# Patient Record
Sex: Female | Born: 1990 | Hispanic: Yes | Marital: Single | Smoking: Never smoker
Health system: Southern US, Community
[De-identification: ages and names within clinical notes are randomized; demographics above are authoritative.]

## PROBLEM LIST (undated history)

## (undated) ENCOUNTER — Inpatient Hospital Stay (HOSPITAL_COMMUNITY): Payer: Self-pay

## (undated) DIAGNOSIS — G510 Bell's palsy: Secondary | ICD-10-CM

## (undated) DIAGNOSIS — A749 Chlamydial infection, unspecified: Secondary | ICD-10-CM

## (undated) DIAGNOSIS — D649 Anemia, unspecified: Secondary | ICD-10-CM

## (undated) DIAGNOSIS — R011 Cardiac murmur, unspecified: Secondary | ICD-10-CM

## (undated) DIAGNOSIS — R519 Headache, unspecified: Secondary | ICD-10-CM

## (undated) DIAGNOSIS — B999 Unspecified infectious disease: Secondary | ICD-10-CM

## (undated) DIAGNOSIS — R51 Headache: Secondary | ICD-10-CM

## (undated) DIAGNOSIS — J45909 Unspecified asthma, uncomplicated: Secondary | ICD-10-CM

## (undated) HISTORY — PX: TONSILLECTOMY: SUR1361

---

## 2013-07-06 DIAGNOSIS — A749 Chlamydial infection, unspecified: Secondary | ICD-10-CM

## 2013-07-06 HISTORY — DX: Chlamydial infection, unspecified: A74.9

## 2016-12-06 ENCOUNTER — Inpatient Hospital Stay (HOSPITAL_COMMUNITY)
Admission: AD | Admit: 2016-12-06 | Discharge: 2016-12-06 | Disposition: A | Discharge status: Home / Self Care | Payer: Medicaid Other | Source: Ambulatory Visit | Attending: Obstetrics and Gynecology | Admitting: Obstetrics and Gynecology

## 2016-12-06 ENCOUNTER — Encounter (HOSPITAL_COMMUNITY): Payer: Self-pay | Admitting: *Deleted

## 2016-12-06 DIAGNOSIS — O210 Mild hyperemesis gravidarum: Secondary | ICD-10-CM

## 2016-12-06 DIAGNOSIS — Z3A00 Weeks of gestation of pregnancy not specified: Secondary | ICD-10-CM | POA: Insufficient documentation

## 2016-12-06 DIAGNOSIS — Z91040 Latex allergy status: Secondary | ICD-10-CM | POA: Insufficient documentation

## 2016-12-06 DIAGNOSIS — O26899 Other specified pregnancy related conditions, unspecified trimester: Secondary | ICD-10-CM | POA: Diagnosis not present

## 2016-12-06 DIAGNOSIS — R112 Nausea with vomiting, unspecified: Secondary | ICD-10-CM | POA: Diagnosis present

## 2016-12-06 DIAGNOSIS — Z825 Family history of asthma and other chronic lower respiratory diseases: Secondary | ICD-10-CM | POA: Diagnosis not present

## 2016-12-06 DIAGNOSIS — R109 Unspecified abdominal pain: Secondary | ICD-10-CM | POA: Insufficient documentation

## 2016-12-06 HISTORY — DX: Unspecified asthma, uncomplicated: J45.909

## 2016-12-06 HISTORY — DX: Cardiac murmur, unspecified: R01.1

## 2016-12-06 HISTORY — DX: Unspecified infectious disease: B99.9

## 2016-12-06 HISTORY — DX: Chlamydial infection, unspecified: A74.9

## 2016-12-06 HISTORY — DX: Anemia, unspecified: D64.9

## 2016-12-06 HISTORY — DX: Headache: R51

## 2016-12-06 HISTORY — DX: Headache, unspecified: R51.9

## 2016-12-06 HISTORY — DX: Bell's palsy: G51.0

## 2016-12-06 LAB — COMPREHENSIVE METABOLIC PANEL
ALT: 10 U/L — ABNORMAL LOW (ref 14–54)
ANION GAP: 8 (ref 5–15)
AST: 15 U/L (ref 15–41)
Albumin: 3.8 g/dL (ref 3.5–5.0)
Alkaline Phosphatase: 56 U/L (ref 38–126)
BUN: 6 mg/dL (ref 6–20)
CHLORIDE: 104 mmol/L (ref 101–111)
CO2: 22 mmol/L (ref 22–32)
Calcium: 8.7 mg/dL — ABNORMAL LOW (ref 8.9–10.3)
Creatinine, Ser: 0.49 mg/dL (ref 0.44–1.00)
Glucose, Bld: 87 mg/dL (ref 65–99)
POTASSIUM: 3.3 mmol/L — AB (ref 3.5–5.1)
Sodium: 134 mmol/L — ABNORMAL LOW (ref 135–145)
TOTAL PROTEIN: 7.1 g/dL (ref 6.5–8.1)
Total Bilirubin: 0.3 mg/dL (ref 0.3–1.2)

## 2016-12-06 LAB — URINALYSIS, ROUTINE W REFLEX MICROSCOPIC
BILIRUBIN URINE: NEGATIVE
GLUCOSE, UA: NEGATIVE mg/dL
HGB URINE DIPSTICK: NEGATIVE
KETONES UR: NEGATIVE mg/dL
NITRITE: NEGATIVE
PROTEIN: 30 mg/dL — AB
Specific Gravity, Urine: 1.019 (ref 1.005–1.030)
pH: 7 (ref 5.0–8.0)

## 2016-12-06 LAB — POCT PREGNANCY, URINE: PREG TEST UR: POSITIVE — AB

## 2016-12-06 MED ORDER — METOCLOPRAMIDE HCL 10 MG PO TABS: 10.0000 mg | ORAL_TABLET | Freq: Three times a day (TID) | ORAL | 1 refills | Status: DC

## 2016-12-06 MED ORDER — SODIUM CHLORIDE 0.9 % IV SOLN
INTRAVENOUS | Status: DC
  Administered 2016-12-06: 12:00:00 via INTRAVENOUS

## 2016-12-06 MED ORDER — GI COCKTAIL ~~LOC~~
30.0000 mL | Freq: Once | ORAL | Status: AC
  Administered 2016-12-06: 30 mL via ORAL
  Filled 2016-12-06: qty 30

## 2016-12-06 MED ORDER — METOCLOPRAMIDE HCL 5 MG/ML IJ SOLN
10.0000 mg | Freq: Once | INTRAMUSCULAR | Status: AC
  Administered 2016-12-06: 10 mg via INTRAVENOUS
  Filled 2016-12-06: qty 2

## 2016-12-06 NOTE — Discharge Instructions (Signed)
Morning Sickness °Morning sickness is when you feel sick to your stomach (nauseous) during pregnancy. This nauseous feeling may or may not come with vomiting. It often occurs in the morning but can be a problem any time of day. Morning sickness is most common during the first trimester, but it may continue throughout pregnancy. While morning sickness is unpleasant, it is usually harmless unless you develop severe and continual vomiting (hyperemesis gravidarum). This condition requires more intense treatment. °What are the causes? °The cause of morning sickness is not completely known but seems to be related to normal hormonal changes that occur in pregnancy. °What increases the risk? °You are at greater risk if you: °· Experienced nausea or vomiting before your pregnancy. °· Had morning sickness during a previous pregnancy. °· Are pregnant with more than one baby, such as twins. ° °How is this treated? °Do not use any medicines (prescription, over-the-counter, or herbal) for morning sickness without first talking to your health care provider. Your health care provider may prescribe or recommend: °· Vitamin B6 supplements. °· Anti-nausea medicines. °· The herbal medicine ginger. ° °Follow these instructions at home: °· Only take over-the-counter or prescription medicines as directed by your health care provider. °· Taking multivitamins before getting pregnant can prevent or decrease the severity of morning sickness in most women. °· Eat a piece of dry toast or unsalted crackers before getting out of bed in the morning. °· Eat five or six small meals a day. °· Eat dry and bland foods (rice, baked potato). Foods high in carbohydrates are often helpful. °· Do not drink liquids with your meals. Drink liquids between meals. °· Avoid greasy, fatty, and spicy foods. °· Get someone to cook for you if the smell of any food causes nausea and vomiting. °· If you feel nauseous after taking prenatal vitamins, take the vitamins at  night or with a snack. °· Snack on protein foods (nuts, yogurt, cheese) between meals if you are hungry. °· Eat unsweetened gelatins for desserts. °· Wearing an acupressure wristband (worn for sea sickness) may be helpful. °· Acupuncture may be helpful. °· Do not smoke. °· Get a humidifier to keep the air in your house free of odors. °· Get plenty of fresh air. °Contact a health care provider if: °· Your home remedies are not working, and you need medicine. °· You feel dizzy or lightheaded. °· You are losing weight. °Get help right away if: °· You have persistent and uncontrolled nausea and vomiting. °· You pass out (faint). °This information is not intended to replace advice given to you by your health care provider. Make sure you discuss any questions you have with your health care provider. °Document Released: 08/13/2006 Document Revised: 11/28/2015 Document Reviewed: 12/07/2012 °Elsevier Interactive Patient Education © 2017 Elsevier Inc. ° °

## 2016-12-06 NOTE — MAU Note (Signed)
Unable to void at this time.

## 2016-12-06 NOTE — MAU Provider Note (Signed)
History   Patient Erin Hines is a 26 y.o. [phone] At Unknown Here with nausea and vomiting and pain in her side from straining and throwing up. She denies bleeding, pelvic pain, abdominal pain.   CSN: [id number]  Arrival date and time: 12/06/16 9562   First Provider Initiated Contact with Patient 12/06/16 1139      Chief Complaint  Patient presents with  . Emesis During Pregnancy  . Abdominal Cramping   Abdominal Cramping  This is a chronic problem. The current episode started 1 to 4 weeks ago. The problem occurs constantly. The problem has been unchanged. Pain location: pain in her left rib. The pain is at a severity of 7/10. Associated symptoms include nausea and vomiting. Pertinent negatives include no diarrhea or fever.  Emesis   This is a chronic problem. The current episode started 1 to 4 weeks ago. The problem occurs 5 to 10 times per day. The problem has been unchanged. The emesis has an appearance of bile. There has been no fever. Pertinent negatives include no diarrhea or fever.    OB History    Gravida Para Term Preterm AB Living   5 3 3  0 1 3   SAB TAB Ectopic Multiple Live Births     1     3      Obstetric Comments   TAB- with pills      Past Medical History:  Diagnosis Date  . Anemia   . Asthma   . Bell's palsy   . Chlamydia 2015  . Headache   . Heart murmur    as child  . Infection    UTI    Past Surgical History:  Procedure Laterality Date  . TONSILLECTOMY     age 48    Family History  Problem Relation Age of Onset  . Asthma Mother   . Stroke Maternal Grandfather   . Diabetes Paternal Grandmother     Social History  Substance Use Topics  . Smoking status: Never Smoker  . Smokeless tobacco: Never Used  . Alcohol use No    Allergies:  Allergies  Allergen Reactions  . Latex Swelling and Other (See Comments)    Reaction:  Localized swelling    No prescriptions prior to admission.    Review of Systems  Constitutional: Negative  for fever.  Respiratory: Negative.   Cardiovascular: Negative.   Gastrointestinal: Positive for nausea and vomiting. Negative for diarrhea.  Genitourinary: Negative.  Negative for vaginal bleeding and vaginal discharge.  Musculoskeletal: Negative.   Neurological: Negative.    Physical Exam   Blood pressure 127/76, pulse 73, temperature 98.1 F (36.7 C), temperature source Oral, resp. rate 16, weight 177 lb 4 oz (80.4 kg), SpO2 100 %.  Physical Exam  Constitutional: She is oriented to person, place, and time. She appears well-developed.  HENT:  Head: Normocephalic.  Neck: Normal range of motion.  Respiratory: Effort normal.  GI: Soft.  Musculoskeletal: Normal range of motion.  Neurological: She is alert and oriented to person, place, and time. She has normal reflexes.  Skin: Skin is warm and dry.  Psychiatric: She has a normal mood and affect.    MAU Course  Procedures  MDM -IV NS with reglan; says that reglan worked for her in the past.  Patient feels much better; was able to keep down crackers and soda.   Assessment and Plan   1. Morning sickness    2. Patient stable for discharge; requesting prescription for reglan. RX given.  3. Will give list of providers and recommend [facility] for initial prenatal visit.  Reviewed bleeding precautions and when to return to MAU (unable to keep liquids down) Charlesetta GaribaldiKathryn Lorraine Kooistra CNM 12/06/2016, 11:41 AM

## 2016-12-06 NOTE — MAU Note (Addendum)
Is pregnant, is extremely nauseous.  Can't keep anything down.  Having cramping off and on, none today.  Had abortion in Feb, no bleeding since, last had intercourse mid March, believes that is when she conceived

## 2016-12-06 NOTE — Progress Notes (Signed)
1322: IV d/c'ed per order.  1335: Discharge instructions given with pt understanding. Pt left unit via ambulatory with SO.

## 2016-12-14 ENCOUNTER — Encounter (HOSPITAL_COMMUNITY): Payer: Self-pay | Admitting: *Deleted

## 2016-12-14 ENCOUNTER — Inpatient Hospital Stay (HOSPITAL_COMMUNITY)
Admission: AD | Admit: 2016-12-14 | Discharge: 2016-12-14 | Disposition: A | Discharge status: Home / Self Care | Payer: Medicaid Other | Source: Ambulatory Visit | Attending: Obstetrics and Gynecology | Admitting: Obstetrics and Gynecology

## 2016-12-14 DIAGNOSIS — Z3491 Encounter for supervision of normal pregnancy, unspecified, first trimester: Secondary | ICD-10-CM

## 2016-12-14 DIAGNOSIS — O219 Vomiting of pregnancy, unspecified: Secondary | ICD-10-CM

## 2016-12-14 DIAGNOSIS — O99011 Anemia complicating pregnancy, first trimester: Secondary | ICD-10-CM | POA: Diagnosis not present

## 2016-12-14 DIAGNOSIS — O99511 Diseases of the respiratory system complicating pregnancy, first trimester: Secondary | ICD-10-CM | POA: Insufficient documentation

## 2016-12-14 DIAGNOSIS — J45909 Unspecified asthma, uncomplicated: Secondary | ICD-10-CM | POA: Diagnosis not present

## 2016-12-14 DIAGNOSIS — Z833 Family history of diabetes mellitus: Secondary | ICD-10-CM | POA: Insufficient documentation

## 2016-12-14 DIAGNOSIS — Z3A00 Weeks of gestation of pregnancy not specified: Secondary | ICD-10-CM | POA: Diagnosis not present

## 2016-12-14 DIAGNOSIS — R197 Diarrhea, unspecified: Secondary | ICD-10-CM | POA: Diagnosis present

## 2016-12-14 DIAGNOSIS — Z823 Family history of stroke: Secondary | ICD-10-CM | POA: Insufficient documentation

## 2016-12-14 LAB — URINALYSIS, ROUTINE W REFLEX MICROSCOPIC
Bilirubin Urine: NEGATIVE
Glucose, UA: NEGATIVE mg/dL
HGB URINE DIPSTICK: NEGATIVE
Ketones, ur: NEGATIVE mg/dL
Leukocytes, UA: NEGATIVE
NITRITE: NEGATIVE
PH: 7 (ref 5.0–8.0)
Protein, ur: NEGATIVE mg/dL
SPECIFIC GRAVITY, URINE: 1.015 (ref 1.005–1.030)

## 2016-12-14 LAB — CBC
HCT: 32 % — ABNORMAL LOW (ref 36.0–46.0)
Hemoglobin: 10.5 g/dL — ABNORMAL LOW (ref 12.0–15.0)
MCH: 24.8 pg — AB (ref 26.0–34.0)
MCHC: 32.8 g/dL (ref 30.0–36.0)
MCV: 75.7 fL — AB (ref 78.0–100.0)
PLATELETS: 206 10*3/uL (ref 150–400)
RBC: 4.23 MIL/uL (ref 3.87–5.11)
RDW: 18.7 % — ABNORMAL HIGH (ref 11.5–15.5)
WBC: 8.1 10*3/uL (ref 4.0–10.5)

## 2016-12-14 MED ORDER — ONDANSETRON 4 MG PO TBDP: 4.0000 mg | ORAL_TABLET | Freq: Three times a day (TID) | ORAL | 0 refills | Status: DC | PRN

## 2016-12-14 MED ORDER — ONDANSETRON 8 MG PO TBDP
8.0000 mg | ORAL_TABLET | Freq: Once | ORAL | Status: AC
  Administered 2016-12-14: 8 mg via ORAL
  Filled 2016-12-14: qty 1

## 2016-12-14 NOTE — MAU Provider Note (Signed)
History     CSN: [id number]  Arrival date and time: 12/14/16 0706   First Provider Initiated Contact with Patient 12/14/16 [phone]      Chief Complaint  Patient presents with  . Emesis   Emesis   This is a new problem. The current episode started today. The problem occurs 2 to 4 times per day. The problem has been unchanged. The emesis has an appearance of stomach contents. There has been no fever. Associated symptoms include diarrhea (more than 10 times since yesterday ). Pertinent negatives include no chills or fever. Risk factors include suspect food intake (patient states that she ate at a BBQ yesterday, and has had diarrhea and vomiting since. ). The treatment provided no relief (She has reglan that she was given, but she has not been able to keep that medication down today. ).   Past Medical History:  Diagnosis Date  . Anemia   . Asthma   . Bell's palsy   . Chlamydia 2015  . Headache   . Heart murmur    as child  . Infection    UTI    Past Surgical History:  Procedure Laterality Date  . TONSILLECTOMY     age 26    Family History  Problem Relation Age of Onset  . Asthma Mother   . Stroke Maternal Grandfather   . Diabetes Paternal Grandmother     Social History  Substance Use Topics  . Smoking status: Never Smoker  . Smokeless tobacco: Never Used  . Alcohol use No    Allergies:  Allergies  Allergen Reactions  . Latex Swelling and Other (See Comments)    Reaction:  Localized swelling    Prescriptions Prior to Admission  Medication Sig Dispense Refill Last Dose  . metoCLOPramide (REGLAN) 10 MG tablet Take 1 tablet (10 mg total) by mouth 3 (three) times daily before meals. 30 tablet 1     Review of Systems  Constitutional: Negative for chills and fever.       Hot flashes with vomiting.   Gastrointestinal: Positive for diarrhea (more than 10 times since yesterday ), nausea and vomiting.  Genitourinary: Negative for dysuria, vaginal bleeding and vaginal  discharge.   Physical Exam   Blood pressure 115/66, pulse 74, temperature 97.5 F (36.4 C), temperature source Oral, resp. rate 18, SpO2 100 %.  Physical Exam  Nursing note and vitals reviewed. Constitutional: She is oriented to person, place, and time. She appears well-developed and well-nourished. No distress.  HENT:  Head: Normocephalic.  Cardiovascular: Normal rate.   Respiratory: Effort normal.  GI: Soft. There is no tenderness. There is no rebound.  Neurological: She is alert and oriented to person, place, and time.  Skin: Skin is warm and dry.  Psychiatric: She has a normal mood and affect.     Results for orders placed or performed during the hospital encounter of 12/14/16 (from the past 24 hour(s))  Urinalysis, Routine w reflex microscopic     Status: Abnormal   Collection Time: 12/14/16  7:09 AM  Result Value Ref Range   Color, Urine YELLOW YELLOW   APPearance HAZY (A) CLEAR   Specific Gravity, Urine 1.015 1.005 - 1.030   pH 7.0 5.0 - 8.0   Glucose, UA NEGATIVE NEGATIVE mg/dL   Hgb urine dipstick NEGATIVE NEGATIVE   Bilirubin Urine NEGATIVE NEGATIVE   Ketones, ur NEGATIVE NEGATIVE mg/dL   Protein, ur NEGATIVE NEGATIVE mg/dL   Nitrite NEGATIVE NEGATIVE   Leukocytes, UA NEGATIVE NEGATIVE  MAU Course  Procedures Results for orders placed or performed during the hospital encounter of 12/14/16 (from the past 24 hour(s))  Urinalysis, Routine w reflex microscopic     Status: Abnormal   Collection Time: 12/14/16  7:09 AM  Result Value Ref Range   Color, Urine YELLOW YELLOW   APPearance HAZY (A) CLEAR   Specific Gravity, Urine 1.015 1.005 - 1.030   pH 7.0 5.0 - 8.0   Glucose, UA NEGATIVE NEGATIVE mg/dL   Hgb urine dipstick NEGATIVE NEGATIVE   Bilirubin Urine NEGATIVE NEGATIVE   Ketones, ur NEGATIVE NEGATIVE mg/dL   Protein, ur NEGATIVE NEGATIVE mg/dL   Nitrite NEGATIVE NEGATIVE   Leukocytes, UA NEGATIVE NEGATIVE  CBC     Status: Abnormal   Collection Time:  12/14/16  7:44 AM  Result Value Ref Range   WBC 8.1 4.0 - 10.5 K/uL   RBC 4.23 3.87 - 5.11 MIL/uL   Hemoglobin 10.5 (L) 12.0 - 15.0 g/dL   HCT 16.1 (L) 09.6 - 04.5 %   MCV 75.7 (L) 78.0 - 100.0 fL   MCH 24.8 (L) 26.0 - 34.0 pg   MCHC 32.8 30.0 - 36.0 g/dL   RDW 40.9 (H) 81.1 - 91.4 %   Platelets 206 150 - 400 K/uL    MDM Patient is not dehydrated today Zofran ODT given Patient tolerating PO food and fluids  0800 Care turned over to E. Lyman Bishop, NP Thressa Sheller 7:51 AM 12/14/16   Pt able to keep down ice & ginger ale after medication Assessment and Plan  A:  1. Nausea and vomiting during pregnancy prior to [redacted] weeks gestation   2. Fetal heart tones present, first trimester    P: Discharge home Rx zofran Start prenatal care Discussed reasons to return to MAU

## 2016-12-14 NOTE — Discharge Instructions (Signed)
Prenatal Care [facility]    [facility] [phone]                Phone: [phone]          [facility]                      [facility]  [place]                        Phone: [phone] Phone: [phone]        [facility] [facility]    Phone: [phone]  Phone: [phone]          [facility] [place]    phone: [phone] Phone: [phone]  [facility] Phone: [phone]          [facility] Dept.                Phone: [phone] [facility]  [phone]                Family [address] [place])        Phone: [phone] [facility] Phone: [phone]       Morning Sickness Morning sickness is when you feel sick to your stomach (nauseous) during pregnancy. This nauseous feeling may or may not come with vomiting. It often occurs in the morning but can be a problem any time of day. Morning sickness is most common during the first trimester, but it may continue throughout pregnancy. While morning sickness is unpleasant, it is usually harmless unless you develop severe and continual vomiting (hyperemesis gravidarum). This condition requires more intense treatment. What are the causes? The cause of morning sickness is not completely known but seems to be related to normal hormonal changes that occur in pregnancy. What increases the risk? You are at greater risk if you:  Experienced nausea or vomiting before your pregnancy.  Had morning sickness during a previous pregnancy.  Are pregnant with more than one baby, such as twins.  How is this treated? Do not use any medicines (prescription, over-the-counter, or herbal) for morning sickness without first talking to your health care provider. Your health care provider may prescribe or recommend:  Vitamin B6  supplements.  Anti-nausea medicines.  The herbal medicine ginger.  Follow these instructions at home:  Only take over-the-counter or prescription medicines as directed by your health care provider.  Taking multivitamins before getting pregnant can prevent or decrease the severity of morning sickness in most women.  Eat a piece of dry toast or unsalted crackers before getting out of bed in the morning.  Eat five or six small meals a day.  Eat dry and bland foods (rice, baked potato). Foods high in carbohydrates are often helpful.  Do not drink liquids with your meals. Drink liquids between meals.  Avoid greasy, fatty, and spicy foods.  Get someone to cook for you if the smell of any food causes nausea and vomiting.  If you feel nauseous after taking prenatal vitamins, take the vitamins at night or with a snack.  Snack on protein foods (nuts, yogurt, cheese) between meals if you are hungry.  Eat unsweetened gelatins for desserts.  Wearing an acupressure wristband (worn for sea sickness) may be helpful.  Acupuncture may be helpful.  Do not smoke.  Get a  humidifier to keep the air in your house free of odors.  Get plenty of fresh air. Contact a health care provider if:  Your home remedies are not working, and you need medicine.  You feel dizzy or lightheaded.  You are losing weight. Get help right away if:  You have persistent and uncontrolled nausea and vomiting.  You pass out (faint). This information is not intended to replace advice given to you by your health care provider. Make sure you discuss any questions you have with your health care provider. Document Released: 08/13/2006 Document Revised: 11/28/2015 Document Reviewed: 12/07/2012 Elsevier Interactive Patient Education  2017 [employer]

## 2016-12-14 NOTE — MAU Note (Addendum)
+  nausea/vomiting Emesis x 2 in past 24 hours States has a hard time catching breath following emesis. States cramping only with emesis.   +diarrhea x3 times in past 24 hours  Unable to eat or drink anything since last night. Patient questions if its something she ate when she went out. Unable to hold reglan down.   Symptoms started last night.   Denies being around anyone sick. Denies pain at this time.

## 2016-12-25 ENCOUNTER — Ambulatory Visit (INDEPENDENT_AMBULATORY_CARE_PROVIDER_SITE_OTHER): Payer: Medicaid Other | Admitting: Obstetrics

## 2016-12-25 ENCOUNTER — Encounter: Payer: Self-pay | Admitting: Obstetrics

## 2016-12-25 ENCOUNTER — Other Ambulatory Visit (HOSPITAL_COMMUNITY)
Admission: RE | Admit: 2016-12-25 | Discharge: 2016-12-25 | Disposition: A | Discharge status: Home / Self Care | Payer: Medicaid Other | Source: Ambulatory Visit | Attending: Obstetrics | Admitting: Obstetrics

## 2016-12-25 VITALS — BP 121/72 | HR 77 | Wt 177.0 lb

## 2016-12-25 DIAGNOSIS — O9989 Other specified diseases and conditions complicating pregnancy, childbirth and the puerperium: Secondary | ICD-10-CM

## 2016-12-25 DIAGNOSIS — N870 Mild cervical dysplasia: Secondary | ICD-10-CM | POA: Diagnosis not present

## 2016-12-25 DIAGNOSIS — O99891 Other specified diseases and conditions complicating pregnancy: Secondary | ICD-10-CM

## 2016-12-25 DIAGNOSIS — Z124 Encounter for screening for malignant neoplasm of cervix: Secondary | ICD-10-CM | POA: Insufficient documentation

## 2016-12-25 DIAGNOSIS — R87612 Low grade squamous intraepithelial lesion on cytologic smear of cervix (LGSIL): Secondary | ICD-10-CM

## 2016-12-25 DIAGNOSIS — Z3481 Encounter for supervision of other normal pregnancy, first trimester: Secondary | ICD-10-CM | POA: Diagnosis not present

## 2016-12-25 DIAGNOSIS — R8271 Bacteriuria: Secondary | ICD-10-CM

## 2016-12-25 DIAGNOSIS — B9689 Other specified bacterial agents as the cause of diseases classified elsewhere: Secondary | ICD-10-CM | POA: Insufficient documentation

## 2016-12-25 DIAGNOSIS — Z3490 Encounter for supervision of normal pregnancy, unspecified, unspecified trimester: Secondary | ICD-10-CM

## 2016-12-25 DIAGNOSIS — Z3687 Encounter for antenatal screening for uncertain dates: Secondary | ICD-10-CM

## 2016-12-25 NOTE — Addendum Note (Signed)
Addended by: Coral CeoHARPER, Maydell Knoebel A on: 12/25/2016 10:08 AM   Modules accepted: Orders

## 2016-12-25 NOTE — Addendum Note (Signed)
Addended by: Coral CeoHARPER, Shaylan Tutton A on: 12/25/2016 10:16 AM   Modules accepted: Orders

## 2016-12-25 NOTE — Progress Notes (Signed)
Subjective:  Erin Hines is a 26 y.o. [phone] at 1531w1d being seen today for ongoing prenatal care.  She is currently monitored for the following issues for this low-risk pregnancy and has Encounter for suprvsn of normal pregnancy, unsp trimester on her problem list.  Patient reports nausea and vomiting.   . Vag. Bleeding: None.  Movement: Absent. Denies leaking of fluid.   The following portions of the patient's history were reviewed and updated as appropriate: allergies, current medications, past family history, past medical history, past social history, past surgical history and problem list. Problem list updated.  Objective:   Vitals:   12/25/16 0930  BP: 121/72  Pulse: 77  Weight: 177 lb (80.3 kg)    Fetal Status: Fetal Heart Rate (bpm): 160   Movement: Absent     General:  Alert, oriented and cooperative. Patient is in no acute distress.  Skin: Skin is warm and dry. No rash noted.   Cardiovascular: Normal heart rate noted  Respiratory: Normal respiratory effort, no problems with respiration noted  Abdomen: Soft, gravid, appropriate for gestational age.       Pelvic:  Cervical exam deferred        Extremities: Normal range of motion.  Edema: None  Mental Status: Normal mood and affect. Normal behavior. Normal judgment and thought content.   Urinalysis:      Assessment and Plan:  Pregnancy: [id number] at 4431w1d  1. Encounter for suprvsn of normal pregnancy, unsp trimester Rx: - Hemoglobinopathy evaluation - Obstetric Panel, Including HIV - Culture, OB Urine - VITAMIN D 25 Hydroxy (Vit-D Deficiency, Fractures) - Varicella zoster antibody, IgG - Cervicovaginal ancillary only - US MFM OB COMP + 14 WK; Future  2. Screening for malignant neoplasm of cervix Rx: - Cytology - PAP  Preterm labor symptoms and general obstetric precautions including but not limited to vaginal bleeding, contractions, leaking of fluid and fetal movement were reviewed in detail with the  patient. Please refer to After Visit Summary for other counseling recommendations.  Return in about 2 weeks (around 01/08/2017) for ROB.   Brock BadHarper, Charles A, MDPatient ID: Erin Ramaaylor Vaughn, female   DOB: 11/20/1990, 26 y.o.   MRN: [id number]

## 2016-12-25 NOTE — Progress Notes (Signed)
Pt presents for NOB unsure LMP. Normal pap smear 07/2016 in [place]. Pregnancy was planned and currently lives with FOB.

## 2016-12-28 ENCOUNTER — Other Ambulatory Visit: Payer: Self-pay | Admitting: Obstetrics

## 2016-12-28 DIAGNOSIS — B952 Enterococcus as the cause of diseases classified elsewhere: Secondary | ICD-10-CM

## 2016-12-28 DIAGNOSIS — N390 Urinary tract infection, site not specified: Principal | ICD-10-CM

## 2016-12-28 LAB — URINE CULTURE, OB REFLEX

## 2016-12-28 LAB — CULTURE, OB URINE

## 2016-12-28 MED ORDER — AMOXICILLIN-POT CLAVULANATE 875-125 MG PO TABS: 1.0000 | ORAL_TABLET | Freq: Two times a day (BID) | ORAL | 0 refills | Status: DC

## 2016-12-29 LAB — OBSTETRIC PANEL, INCLUDING HIV
ANTIBODY SCREEN: NEGATIVE
BASOS: 0 %
Basophils Absolute: 0 10*3/uL (ref 0.0–0.2)
EOS (ABSOLUTE): 0.1 10*3/uL (ref 0.0–0.4)
Eos: 1 %
HEMATOCRIT: 34.2 % (ref 34.0–46.6)
HIV SCREEN 4TH GENERATION: NONREACTIVE
Hemoglobin: 11.1 g/dL (ref 11.1–15.9)
Hepatitis B Surface Ag: NEGATIVE
Immature Grans (Abs): 0 10*3/uL (ref 0.0–0.1)
Immature Granulocytes: 0 %
LYMPHS ABS: 1.7 10*3/uL (ref 0.7–3.1)
Lymphs: 20 %
MCH: 25.5 pg — AB (ref 26.6–33.0)
MCHC: 32.5 g/dL (ref 31.5–35.7)
MCV: 78 fL — AB (ref 79–97)
MONOS ABS: 0.3 10*3/uL (ref 0.1–0.9)
Monocytes: 4 %
NEUTROS ABS: 6.5 10*3/uL (ref 1.4–7.0)
Neutrophils: 75 %
Platelets: 237 10*3/uL (ref 150–379)
RBC: 4.36 x10E6/uL (ref 3.77–5.28)
RDW: 19.1 % — AB (ref 12.3–15.4)
RH TYPE: POSITIVE
RPR Ser Ql: NONREACTIVE
Rubella Antibodies, IGG: 6.83 index (ref >0.99)
WBC: 8.7 10*3/uL (ref 3.4–10.8)

## 2016-12-29 LAB — HEMOGLOBINOPATHY EVALUATION
HEMOGLOBIN F QUANTITATION: 0 % (ref 0.0–2.0)
HGB A: 98 % (ref 96.4–98.8)
HGB C: 0 %
HGB S: 0 %
HGB VARIANT: 0 %
Hemoglobin A2 Quantitation: 2 % (ref 1.8–3.2)

## 2016-12-29 LAB — VARICELLA ZOSTER ANTIBODY, IGG: VARICELLA: 2904 {index} (ref >165)

## 2016-12-30 ENCOUNTER — Telehealth: Payer: Self-pay

## 2016-12-30 LAB — CYTOLOGY - PAP
Bacterial vaginitis: POSITIVE — AB
CHLAMYDIA, DNA PROBE: NEGATIVE
Candida vaginitis: NEGATIVE
NEISSERIA GONORRHEA: NEGATIVE
Trichomonas: NEGATIVE

## 2016-12-30 NOTE — Telephone Encounter (Signed)
Pt aware UTI results and ABx is at the pharmacy.

## 2017-01-01 ENCOUNTER — Ambulatory Visit (HOSPITAL_COMMUNITY)
Admission: RE | Admit: 2017-01-01 | Discharge: 2017-01-01 | Disposition: A | Discharge status: Home / Self Care | Payer: Medicaid Other | Source: Ambulatory Visit | Attending: Obstetrics | Admitting: Obstetrics

## 2017-01-01 DIAGNOSIS — Z3687 Encounter for antenatal screening for uncertain dates: Secondary | ICD-10-CM | POA: Insufficient documentation

## 2017-01-01 DIAGNOSIS — Z3A13 13 weeks gestation of pregnancy: Secondary | ICD-10-CM | POA: Insufficient documentation

## 2017-01-02 DIAGNOSIS — O99891 Other specified diseases and conditions complicating pregnancy: Secondary | ICD-10-CM | POA: Insufficient documentation

## 2017-01-02 DIAGNOSIS — R8271 Bacteriuria: Secondary | ICD-10-CM | POA: Insufficient documentation

## 2017-01-02 DIAGNOSIS — O9989 Other specified diseases and conditions complicating pregnancy, childbirth and the puerperium: Secondary | ICD-10-CM

## 2017-01-02 DIAGNOSIS — R87612 Low grade squamous intraepithelial lesion on cytologic smear of cervix (LGSIL): Secondary | ICD-10-CM | POA: Insufficient documentation

## 2017-01-11 ENCOUNTER — Other Ambulatory Visit: Payer: Self-pay | Admitting: Obstetrics

## 2017-01-11 DIAGNOSIS — O219 Vomiting of pregnancy, unspecified: Secondary | ICD-10-CM

## 2017-01-11 MED ORDER — PROMETHAZINE HCL 25 MG PO TABS: 25.0000 mg | ORAL_TABLET | Freq: Four times a day (QID) | ORAL | 2 refills | Status: DC | PRN

## 2017-01-12 ENCOUNTER — Encounter (HOSPITAL_COMMUNITY): Payer: Self-pay | Admitting: *Deleted

## 2017-01-12 ENCOUNTER — Inpatient Hospital Stay (HOSPITAL_COMMUNITY)
Admission: AD | Admit: 2017-01-12 | Discharge: 2017-01-12 | Disposition: A | Discharge status: Home / Self Care | Payer: Medicaid Other | Source: Ambulatory Visit | Attending: Family Medicine | Admitting: Family Medicine

## 2017-01-12 DIAGNOSIS — B3731 Acute candidiasis of vulva and vagina: Secondary | ICD-10-CM

## 2017-01-12 DIAGNOSIS — Z91040 Latex allergy status: Secondary | ICD-10-CM | POA: Diagnosis not present

## 2017-01-12 DIAGNOSIS — G43909 Migraine, unspecified, not intractable, without status migrainosus: Secondary | ICD-10-CM | POA: Diagnosis not present

## 2017-01-12 DIAGNOSIS — Z3A15 15 weeks gestation of pregnancy: Secondary | ICD-10-CM | POA: Diagnosis not present

## 2017-01-12 DIAGNOSIS — Z87440 Personal history of urinary (tract) infections: Secondary | ICD-10-CM | POA: Insufficient documentation

## 2017-01-12 DIAGNOSIS — G43009 Migraine without aura, not intractable, without status migrainosus: Secondary | ICD-10-CM

## 2017-01-12 DIAGNOSIS — Z331 Pregnant state, incidental: Secondary | ICD-10-CM

## 2017-01-12 DIAGNOSIS — O99352 Diseases of the nervous system complicating pregnancy, second trimester: Secondary | ICD-10-CM | POA: Insufficient documentation

## 2017-01-12 DIAGNOSIS — Z825 Family history of asthma and other chronic lower respiratory diseases: Secondary | ICD-10-CM | POA: Insufficient documentation

## 2017-01-12 DIAGNOSIS — B373 Candidiasis of vulva and vagina: Secondary | ICD-10-CM | POA: Insufficient documentation

## 2017-01-12 DIAGNOSIS — O98812 Other maternal infectious and parasitic diseases complicating pregnancy, second trimester: Secondary | ICD-10-CM | POA: Diagnosis not present

## 2017-01-12 DIAGNOSIS — R51 Headache: Secondary | ICD-10-CM | POA: Diagnosis present

## 2017-01-12 DIAGNOSIS — Z3492 Encounter for supervision of normal pregnancy, unspecified, second trimester: Secondary | ICD-10-CM

## 2017-01-12 LAB — URINALYSIS, ROUTINE W REFLEX MICROSCOPIC
BILIRUBIN URINE: NEGATIVE
GLUCOSE, UA: NEGATIVE mg/dL
HGB URINE DIPSTICK: NEGATIVE
KETONES UR: NEGATIVE mg/dL
NITRITE: NEGATIVE
PROTEIN: NEGATIVE mg/dL
Specific Gravity, Urine: 1.019 (ref 1.005–1.030)
pH: 6 (ref 5.0–8.0)

## 2017-01-12 LAB — WET PREP, GENITAL
Clue Cells Wet Prep HPF POC: NONE SEEN
Sperm: NONE SEEN
Trich, Wet Prep: NONE SEEN

## 2017-01-12 MED ORDER — BUTALBITAL-APAP-CAFFEINE 50-325-40 MG PO TABS: 1.0000 | ORAL_TABLET | Freq: Four times a day (QID) | ORAL | 0 refills | Status: DC | PRN

## 2017-01-12 MED ORDER — TERCONAZOLE 0.8 % VA CREA: 1.0000 | TOPICAL_CREAM | Freq: Every day | VAGINAL | 0 refills | Status: DC

## 2017-01-12 MED ORDER — METOCLOPRAMIDE HCL 10 MG PO TABS
10.0000 mg | ORAL_TABLET | Freq: Once | ORAL | Status: AC
  Administered 2017-01-12: 10 mg via ORAL
  Filled 2017-01-12: qty 1

## 2017-01-12 MED ORDER — ACETAMINOPHEN 500 MG PO TABS
1000.0000 mg | ORAL_TABLET | Freq: Once | ORAL | Status: AC
  Administered 2017-01-12: 1000 mg via ORAL
  Filled 2017-01-12: qty 2

## 2017-01-12 NOTE — MAU Note (Signed)
Headache since Friday.   [facility]  Is on left side only, light is bothering her this morning.  Noted watery d/c since yesterday, clear, no odor.

## 2017-01-12 NOTE — MAU Note (Addendum)
Pt states she received prescription for phenergan yesterday, has taken one dose so far, last night.  Hasn't vomited since, had some nausea this morning.  Has had diarrhea the entire pregnancy.

## 2017-01-12 NOTE — Discharge Instructions (Signed)

## 2017-01-12 NOTE — MAU Provider Note (Signed)
History     CSN: [id number]  Arrival date and time: 01/12/17 0730  First Provider Initiated Contact with Patient 01/12/17 [phone]      Chief Complaint  Patient presents with  . Headache  . Vaginal Discharge   HPI Erin Hines is a 26 y.o. [id number] at [redacted]w[redacted]d who presents with headache & vaginal discharge. Reports headache since Friday. History of migraines & states this feels like a migraine. In the past was seen & tx by neurologist but doesn't remember what meds she took. Current headache is constant throbbing on right side of head. Rates pain 10/10. Light makes pain worse. Has not treated.  Reports increase in vaginal discharge since Thursday. Discharge is thin & yellow. No odor. Associated with vaginal irritation. Denies dysuria, abdominal pain, or vaginal bleeding.   OB History    Gravida Para Term Preterm AB Living   5 3 3  0 1 3   SAB TAB Ectopic Multiple Live Births     1     3      Obstetric Comments   TAB- with pills      Past Medical History:  Diagnosis Date  . Anemia   . Asthma   . Bell's palsy   . Chlamydia 2015  . Headache   . Heart murmur    as child  . Infection    UTI    Past Surgical History:  Procedure Laterality Date  . TONSILLECTOMY     age 72    Family History  Problem Relation Age of Onset  . Asthma Mother   . Stroke Maternal Grandfather   . Diabetes Paternal Grandmother     Social History  Substance Use Topics  . Smoking status: Never Smoker  . Smokeless tobacco: Never Used  . Alcohol use No    Allergies:  Allergies  Allergen Reactions  . Latex Swelling and Other (See Comments)    Reaction:  Localized swelling    Prescriptions Prior to Admission  Medication Sig Dispense Refill Last Dose  . amoxicillin-clavulanate (AUGMENTIN) 875-125 MG tablet Take 1 tablet by mouth 2 (two) times daily. 14 tablet 0   . metoCLOPramide (REGLAN) 10 MG tablet Take 1 tablet (10 mg total) by mouth 3 (three) times daily before meals. (Patient not taking:  Reported on 12/25/2016) 30 tablet 1 Not Taking  . ondansetron (ZOFRAN ODT) 4 MG disintegrating tablet Take 1 tablet (4 mg total) by mouth every 8 (eight) hours as needed for nausea or vomiting. (Patient not taking: Reported on 12/25/2016) 15 tablet 0 Not Taking  . promethazine (PHENERGAN) 25 MG tablet Take 1 tablet (25 mg total) by mouth every 6 (six) hours as needed for nausea or vomiting. 30 tablet 2     Review of Systems  Constitutional: Negative.   Eyes: Positive for photophobia. Negative for discharge and visual disturbance.  Respiratory: Negative.   Cardiovascular: Negative.   Gastrointestinal: Positive for diarrhea, nausea and vomiting. Negative for abdominal pain and constipation.  Genitourinary: Positive for vaginal discharge. Negative for dysuria and vaginal bleeding.  Neurological: Positive for headaches. Negative for dizziness and light-headedness.   Physical Exam   Blood pressure (!) 91/48, pulse 79, temperature 98.2 F (36.8 C), temperature source Oral, resp. rate 18, weight 177 lb 4 oz (80.4 kg), SpO2 100 %.  Physical Exam  Nursing note and vitals reviewed. Constitutional: She is oriented to person, place, and time. She appears well-developed and well-nourished. No distress.  HENT:  Head: Normocephalic and atraumatic.  Eyes: Conjunctivae are  normal. Right eye exhibits no discharge. Left eye exhibits no discharge. No scleral icterus.  Neck: Normal range of motion.  Cardiovascular: Normal rate, regular rhythm and normal heart sounds.   No murmur heard. Respiratory: Effort normal and breath sounds normal. No respiratory distress. She has no wheezes.  GI: Soft. Bowel sounds are normal. There is no tenderness.  Genitourinary: Cervix exhibits no friability. No bleeding in the vagina. Vaginal discharge (small amount of thin yellow discharge & minimal amount of clumpy white discharge adheratnt to vaginal walls) found.  Neurological: She is alert and oriented to person, place, and  time.  Skin: Skin is warm and dry. She is not diaphoretic.  Psychiatric: She has a normal mood and affect. Her behavior is normal. Judgment and thought content normal.    MAU Course  Procedures Results for orders placed or performed during the hospital encounter of 01/12/17 (from the past 24 hour(s))  Urinalysis, Routine w reflex microscopic     Status: Abnormal   Collection Time: 01/12/17  7:27 AM  Result Value Ref Range   Color, Urine YELLOW YELLOW   APPearance CLEAR CLEAR   Specific Gravity, Urine 1.019 1.005 - 1.030   pH 6.0 5.0 - 8.0   Glucose, UA NEGATIVE NEGATIVE mg/dL   Hgb urine dipstick NEGATIVE NEGATIVE   Bilirubin Urine NEGATIVE NEGATIVE   Ketones, ur NEGATIVE NEGATIVE mg/dL   Protein, ur NEGATIVE NEGATIVE mg/dL   Nitrite NEGATIVE NEGATIVE   Leukocytes, UA LARGE (A) NEGATIVE   RBC / HPF 0-5 0 - 5 RBC/hpf   WBC, UA 6-30 0 - 5 WBC/hpf   Bacteria, UA RARE (A) NONE SEEN   Squamous Epithelial / LPF 0-5 (A) NONE SEEN   Mucous PRESENT   Wet prep, genital     Status: Abnormal   Collection Time: 01/12/17  8:38 AM  Result Value Ref Range   Yeast Wet Prep HPF POC PRESENT (A) NONE SEEN   Trich, Wet Prep NONE SEEN NONE SEEN   Clue Cells Wet Prep HPF POC NONE SEEN NONE SEEN   WBC, Wet Prep HPF POC MANY (A) NONE SEEN   Sperm NONE SEEN     MDM FHT 150 Just finished abx for UTI -- will send urine for cx Had negative GC/CT 2 weeks ago; will obtain wet prep today Tylenol 1 gm & reglan 10 mg PO Patient reports improvement in headache Assessment and Plan  A: 1. Migraine without aura and without status migrainosus, not intractable   2. Fetal heart tones present, second trimester   3. Vaginal yeast infection    P; Discharge home Rx terazol & fioricet Discussed reasons to return to MAU Has f/u with ob later this week Urine culture pending  Judeth Hornrin Rylea Selway 01/12/2017, 8:05 AM

## 2017-01-13 DIAGNOSIS — Z3493 Encounter for supervision of normal pregnancy, unspecified, third trimester: Secondary | ICD-10-CM

## 2017-01-13 LAB — CULTURE, OB URINE

## 2017-01-14 ENCOUNTER — Encounter: Payer: Self-pay | Admitting: Obstetrics

## 2017-01-14 ENCOUNTER — Ambulatory Visit (INDEPENDENT_AMBULATORY_CARE_PROVIDER_SITE_OTHER): Payer: Medicaid Other | Admitting: Obstetrics

## 2017-01-14 VITALS — BP 127/65 | HR 93 | Wt 181.1 lb

## 2017-01-14 DIAGNOSIS — Z8669 Personal history of other diseases of the nervous system and sense organs: Secondary | ICD-10-CM

## 2017-01-14 DIAGNOSIS — Z3490 Encounter for supervision of normal pregnancy, unspecified, unspecified trimester: Secondary | ICD-10-CM

## 2017-01-14 DIAGNOSIS — Z3482 Encounter for supervision of other normal pregnancy, second trimester: Secondary | ICD-10-CM

## 2017-01-14 NOTE — Progress Notes (Signed)
Subjective:  Erin Hines is a 26 y.o. [phone] at 5762w0d being seen today for ongoing prenatal care.  She is currently monitored for the following issues for this low-risk pregnancy and has Encounter for suprvsn of normal pregnancy, unsp trimester; LGSIL on Pap smear of cervix; and Asymptomatic bacteriuria during pregnancy on her problem list.  Patient reports no complaints.  Contractions: Irritability. Vag. Bleeding: None.   . Denies leaking of fluid.   The following portions of the patient's history were reviewed and updated as appropriate: allergies, current medications, past family history, past medical history, past social history, past surgical history and problem list. Problem list updated.  Objective:   Vitals:   01/14/17 1532  BP: 127/65  Pulse: 93  Weight: 181 lb 1.6 oz (82.1 kg)    Fetal Status: Fetal Heart Rate (bpm): 150         General:  Alert, oriented and cooperative. Patient is in no acute distress.  Skin: Skin is warm and dry. No rash noted.   Cardiovascular: Normal heart rate noted  Respiratory: Normal respiratory effort, no problems with respiration noted  Abdomen: Soft, gravid, appropriate for gestational age. Pain/Pressure: Absent     Pelvic:  Cervical exam deferred        Extremities: Normal range of motion.  Edema: None  Mental Status: Normal mood and affect. Normal behavior. Normal judgment and thought content.   Urinalysis:      Assessment and Plan:  Pregnancy: [id number] at 1762w0d  1. Encounter for suprvsn of normal pregnancy, unsp trimester   2. Hx of migraines Rx: - Ambulatory referral to Neurology  Preterm labor symptoms and general obstetric precautions including but not limited to vaginal bleeding, contractions, leaking of fluid and fetal movement were reviewed in detail with the patient. Please refer to After Visit Summary for other counseling recommendations.  Return in about 4 weeks (around 02/11/2017).   Brock BadHarper, Mirenda Baltazar A, MDPatient ID: Erin Ramaaylor  Hines, female   DOB: 02/03/1991, 26 y.o.   MRN: [id number]

## 2017-01-14 NOTE — Progress Notes (Signed)
Patient reports occasional uterine irritability and states that she is not sure if she is feeling fetal movement yet. Pt is interested in quad screen. Pt complains of vaginal discharge/irritation.

## 2017-01-18 ENCOUNTER — Other Ambulatory Visit: Payer: Medicaid Other

## 2017-01-19 ENCOUNTER — Other Ambulatory Visit: Payer: Medicaid Other

## 2017-01-20 ENCOUNTER — Ambulatory Visit (INDEPENDENT_AMBULATORY_CARE_PROVIDER_SITE_OTHER): Payer: Medicaid Other | Admitting: Obstetrics

## 2017-01-20 DIAGNOSIS — Z3492 Encounter for supervision of normal pregnancy, unspecified, second trimester: Secondary | ICD-10-CM

## 2017-01-20 DIAGNOSIS — Z3490 Encounter for supervision of normal pregnancy, unspecified, unspecified trimester: Secondary | ICD-10-CM

## 2017-01-21 ENCOUNTER — Other Ambulatory Visit: Payer: Self-pay | Admitting: Obstetrics

## 2017-01-21 ENCOUNTER — Telehealth: Payer: Self-pay | Admitting: Obstetrics

## 2017-01-21 DIAGNOSIS — E559 Vitamin D deficiency, unspecified: Secondary | ICD-10-CM

## 2017-01-21 LAB — VITAMIN D 25 HYDROXY (VIT D DEFICIENCY, FRACTURES): Vit D, 25-Hydroxy: 11.4 ng/mL — ABNORMAL LOW (ref 30.0–100.0)

## 2017-01-21 MED ORDER — VITAMIN D 50 MCG (2000 UT) PO CAPS: 1.0000 | ORAL_CAPSULE | Freq: Every day | ORAL | 5 refills | Status: AC

## 2017-01-27 ENCOUNTER — Encounter: Payer: Self-pay | Admitting: Obstetrics

## 2017-01-27 LAB — CYSTIC FIBROSIS MUTATION 97: Interpretation: NOT DETECTED

## 2017-01-27 LAB — AFP, SERUM, OPEN SPINA BIFIDA
AFP MoM: 1.29
AFP VALUE AFPOSL: 40.9 ng/mL
GEST. AGE ON COLLECTION DATE: 16.6 wk
MATERNAL AGE AT EDD: 24.9 a
OSBR Risk 1 IN: 4947
Test Results:: NEGATIVE
Weight: 181 [lb_av]

## 2017-01-27 NOTE — Progress Notes (Signed)
Subjective:  Erin Hines is a 26 y.o. [phone] at 3437w6d being seen today for ongoing prenatal care.  She is currently monitored for the following issues for this low-risk pregnancy and has Encounter for suprvsn of normal pregnancy, unsp trimester; LGSIL on Pap smear of cervix; and Asymptomatic bacteriuria during pregnancy on her problem list.  Patient reports no complaints.   .  .   . Denies leaking of fluid.   The following portions of the patient's history were reviewed and updated as appropriate: allergies, current medications, past family history, past medical history, past social history, past surgical history and problem list. Problem list updated.  Objective:  There were no vitals filed for this visit.  Fetal Status: Fetal Heart Rate (bpm): 150         General:  Alert, oriented and cooperative. Patient is in no acute distress.  Skin: Skin is warm and dry. No rash noted.   Cardiovascular: Normal heart rate noted  Respiratory: Normal respiratory effort, no problems with respiration noted  Abdomen: Soft, gravid, appropriate for gestational age.       Pelvic:  Cervical exam deferred        Extremities: Normal range of motion.     Mental Status: Normal mood and affect. Normal behavior. Normal judgment and thought content.   Urinalysis:      Assessment and Plan:  Pregnancy: [id number] at 6437w6d  1. Encounter for suprvsn of normal pregnancy, unsp trimester Rx: - AFP, Serum, Open Spina Bifida - MaterniT21 PLUS Core+SCA - Cystic Fibrosis Mutation 97  Preterm labor symptoms and general obstetric precautions including but not limited to vaginal bleeding, contractions, leaking of fluid and fetal movement were reviewed in detail with the patient. Please refer to After Visit Summary for other counseling recommendations.  Return in about 4 weeks (around 02/17/2017) for ROB.   Brock BadHarper, [name], MD

## 2017-01-28 ENCOUNTER — Telehealth: Payer: Self-pay | Admitting: Neurology

## 2017-01-28 ENCOUNTER — Ambulatory Visit: Payer: Medicaid Other | Admitting: Neurology

## 2017-01-28 ENCOUNTER — Telehealth: Payer: Self-pay

## 2017-01-28 NOTE — Telephone Encounter (Signed)
Returned call and pt stated that she would call back.

## 2017-01-28 NOTE — Telephone Encounter (Signed)
This patient did not show for a new patient appointment today. 

## 2017-01-29 ENCOUNTER — Encounter: Payer: Self-pay | Admitting: Neurology

## 2017-01-29 LAB — MATERNIT21 PLUS CORE+SCA
Chromosome 13: NEGATIVE
Chromosome 18: NEGATIVE
Chromosome 21: NEGATIVE

## 2017-02-01 ENCOUNTER — Telehealth: Payer: Self-pay

## 2017-02-01 NOTE — Telephone Encounter (Signed)
Pt called and left a message. Called pt back on number provided which is the same number in chart. A lady answered the phone stating that I had the wrong number

## 2017-02-05 ENCOUNTER — Ambulatory Visit (HOSPITAL_COMMUNITY): Payer: Medicaid Other

## 2017-02-11 ENCOUNTER — Ambulatory Visit (INDEPENDENT_AMBULATORY_CARE_PROVIDER_SITE_OTHER): Payer: Medicaid Other | Admitting: Obstetrics

## 2017-02-11 ENCOUNTER — Encounter: Payer: Self-pay | Admitting: Obstetrics

## 2017-02-11 VITALS — BP 108/67 | HR 93 | Ht 64.0 in | Wt 183.4 lb

## 2017-02-11 DIAGNOSIS — Z8669 Personal history of other diseases of the nervous system and sense organs: Secondary | ICD-10-CM

## 2017-02-11 DIAGNOSIS — Z3482 Encounter for supervision of other normal pregnancy, second trimester: Secondary | ICD-10-CM

## 2017-02-11 DIAGNOSIS — Z3490 Encounter for supervision of normal pregnancy, unspecified, unspecified trimester: Secondary | ICD-10-CM

## 2017-02-11 MED ORDER — CITRANATAL BLOOM 90-1 MG PO TABS: 1.0000 | ORAL_TABLET | Freq: Every day | ORAL | 3 refills | Status: AC

## 2017-02-11 NOTE — Progress Notes (Signed)
Subjective:  Erin Hines is a 26 y.o. [phone] at 6755w0d being seen today for ongoing prenatal care.  She is currently monitored for the following issues for this low-risk pregnancy and has Encounter for suprvsn of normal pregnancy, unsp trimester; LGSIL on Pap smear of cervix; and Asymptomatic bacteriuria during pregnancy on her problem list.  Patient reports no complaints.  Contractions: Not present. Vag. Bleeding: None.  Movement: Present. Denies leaking of fluid.   The following portions of the patient's history were reviewed and updated as appropriate: allergies, current medications, past family history, past medical history, past social history, past surgical history and problem list. Problem list updated.  Objective:   Vitals:   02/11/17 0908 02/11/17 0909  BP: 108/67   Pulse: 93   Weight: 183 lb 6.4 oz (83.2 kg)   Height:  5\' 4"  (1.626 m)    Fetal Status: Fetal Heart Rate (bpm): 150   Movement: Present     General:  Alert, oriented and cooperative. Patient is in no acute distress.  Skin: Skin is warm and dry. No rash noted.   Cardiovascular: Normal heart rate noted  Respiratory: Normal respiratory effort, no problems with respiration noted  Abdomen: Soft, gravid, appropriate for gestational age. Pain/Pressure: Absent     Pelvic:  Cervical exam deferred        Extremities: Normal range of motion.  Edema: None  Mental Status: Normal mood and affect. Normal behavior. Normal judgment and thought content.   Urinalysis:      Assessment and Plan:  Pregnancy: [id number] at 455w0d  1. Encounter for suprvsn of normal pregnancy, unsp trimester Rx: - Prenatal-DSS-FeCb-FeGl-FA (CITRANATAL BLOOM) 90-1 MG TABS; Take 1 tablet by mouth daily before breakfast.  Dispense: 90 tablet; Refill: 3  2. Hx of migraines - stable on Fioricet - has Neurology appointment in September  Preterm labor symptoms and general obstetric precautions including but not limited to vaginal bleeding, contractions,  leaking of fluid and fetal movement were reviewed in detail with the patient. Please refer to After Visit Summary for other counseling recommendations.  Return in about 4 weeks (around 03/11/2017) for ROB.   Brock BadHarper, [name], MD

## 2017-02-11 NOTE — Progress Notes (Signed)
Patient is in the office, reports good fetal movement, no complaints, requests rx for PNV.

## 2017-02-12 ENCOUNTER — Other Ambulatory Visit: Payer: Self-pay | Admitting: Obstetrics

## 2017-02-12 ENCOUNTER — Ambulatory Visit (HOSPITAL_COMMUNITY)
Admission: RE | Admit: 2017-02-12 | Discharge: 2017-02-12 | Disposition: A | Discharge status: Home / Self Care | Payer: Medicaid Other | Source: Ambulatory Visit | Attending: Obstetrics | Admitting: Obstetrics

## 2017-02-12 DIAGNOSIS — Z369 Encounter for antenatal screening, unspecified: Secondary | ICD-10-CM

## 2017-02-12 DIAGNOSIS — Z3A19 19 weeks gestation of pregnancy: Secondary | ICD-10-CM | POA: Diagnosis not present

## 2017-02-12 DIAGNOSIS — Z363 Encounter for antenatal screening for malformations: Secondary | ICD-10-CM | POA: Insufficient documentation

## 2017-02-12 DIAGNOSIS — Z3490 Encounter for supervision of normal pregnancy, unspecified, unspecified trimester: Secondary | ICD-10-CM

## 2017-03-01 ENCOUNTER — Telehealth: Payer: Self-pay | Admitting: Obstetrics

## 2017-03-01 ENCOUNTER — Encounter (HOSPITAL_COMMUNITY): Payer: Self-pay | Admitting: *Deleted

## 2017-03-01 ENCOUNTER — Inpatient Hospital Stay (HOSPITAL_COMMUNITY)
Admission: AD | Admit: 2017-03-01 | Discharge: 2017-03-01 | Disposition: A | Discharge status: Home / Self Care | Payer: Medicaid Other | Source: Ambulatory Visit | Attending: Obstetrics & Gynecology | Admitting: Obstetrics & Gynecology

## 2017-03-01 DIAGNOSIS — B9689 Other specified bacterial agents as the cause of diseases classified elsewhere: Secondary | ICD-10-CM | POA: Insufficient documentation

## 2017-03-01 DIAGNOSIS — J45909 Unspecified asthma, uncomplicated: Secondary | ICD-10-CM | POA: Diagnosis not present

## 2017-03-01 DIAGNOSIS — O99512 Diseases of the respiratory system complicating pregnancy, second trimester: Secondary | ICD-10-CM | POA: Diagnosis not present

## 2017-03-01 DIAGNOSIS — N760 Acute vaginitis: Secondary | ICD-10-CM | POA: Diagnosis not present

## 2017-03-01 DIAGNOSIS — Z79899 Other long term (current) drug therapy: Secondary | ICD-10-CM | POA: Diagnosis not present

## 2017-03-01 DIAGNOSIS — Z91040 Latex allergy status: Secondary | ICD-10-CM | POA: Insufficient documentation

## 2017-03-01 DIAGNOSIS — Z823 Family history of stroke: Secondary | ICD-10-CM | POA: Diagnosis not present

## 2017-03-01 DIAGNOSIS — Z833 Family history of diabetes mellitus: Secondary | ICD-10-CM | POA: Diagnosis not present

## 2017-03-01 DIAGNOSIS — O9989 Other specified diseases and conditions complicating pregnancy, childbirth and the puerperium: Secondary | ICD-10-CM

## 2017-03-01 DIAGNOSIS — O26892 Other specified pregnancy related conditions, second trimester: Secondary | ICD-10-CM | POA: Diagnosis present

## 2017-03-01 DIAGNOSIS — Z9889 Other specified postprocedural states: Secondary | ICD-10-CM | POA: Diagnosis not present

## 2017-03-01 DIAGNOSIS — Z825 Family history of asthma and other chronic lower respiratory diseases: Secondary | ICD-10-CM | POA: Diagnosis not present

## 2017-03-01 LAB — WET PREP, GENITAL
SPERM: NONE SEEN
TRICH WET PREP: NONE SEEN
YEAST WET PREP: NONE SEEN

## 2017-03-01 LAB — POCT FERN TEST: POCT Fern Test: NEGATIVE

## 2017-03-01 MED ORDER — METRONIDAZOLE 500 MG PO TABS: 500.0000 mg | ORAL_TABLET | Freq: Three times a day (TID) | ORAL | 0 refills | Status: AC

## 2017-03-01 NOTE — MAU Provider Note (Signed)
Patient Erin Hines is a 26 y.o. [phone] At [redacted]w[redacted]d who is here with complaints of vaginal discharge today. She denies bleeding or decreased fetal movements.   History     CSN: [id number]  Arrival date and time: 03/01/17 1735   First Provider Initiated Contact with Patient 03/01/17 1816      Chief Complaint  Patient presents with  . Rupture of Membranes   Vaginal Discharge  The patient's primary symptoms include vaginal discharge. The patient's pertinent negatives include no missed menses or vaginal bleeding. This is a new problem. The current episode started today. The problem occurs rarely. The problem has been resolved. The patient is experiencing no pain. Pertinent negatives include no abdominal pain, constipation, diarrhea, discolored urine, dysuria or vomiting. The vaginal discharge was watery and mucoid. There has been no bleeding. Nothing aggravates the symptoms. She has tried nothing for the symptoms.   Patient says she felt some mucous-y discharge this afternoon and then changed her underwear. Since then she felt some little droplets on under underwear but no gushes of fluid.    OB History    Gravida Para Term Preterm AB Living   5 3 3  0 1 3   SAB TAB Ectopic Multiple Live Births     1     3      Obstetric Comments   TAB- with pills      Past Medical History:  Diagnosis Date  . Anemia   . Asthma   . Bell's palsy   . Chlamydia 2015  . Headache   . Heart murmur    as child  . Infection    UTI    Past Surgical History:  Procedure Laterality Date  . TONSILLECTOMY     age 33    Family History  Problem Relation Age of Onset  . Asthma Mother   . Stroke Maternal Grandfather   . Diabetes Paternal Grandmother     Social History  Substance Use Topics  . Smoking status: Never Smoker  . Smokeless tobacco: Never Used  . Alcohol use No    Allergies:  Allergies  Allergen Reactions  . Latex Swelling and Other (See Comments)    Reaction:  Localized swelling     Prescriptions Prior to Admission  Medication Sig Dispense Refill Last Dose  . amoxicillin-clavulanate (AUGMENTIN) 875-125 MG tablet Take 1 tablet by mouth 2 (two) times daily. (Patient not taking: Reported on 01/12/2017) 14 tablet 0 Not Taking  . butalbital-acetaminophen-caffeine (FIORICET, ESGIC) 50-325-40 MG tablet Take 1-2 tablets by mouth every 6 (six) hours as needed for headache. 15 tablet 0 Taking  . Cholecalciferol (VITAMIN D) 2000 units CAPS Take 1 capsule (2,000 Units total) by mouth daily before breakfast. (Patient not taking: Reported on 02/11/2017) 30 capsule 5 Not Taking  . metoCLOPramide (REGLAN) 10 MG tablet Take 1 tablet (10 mg total) by mouth 3 (three) times daily before meals. 30 tablet 1 Taking  . ondansetron (ZOFRAN ODT) 4 MG disintegrating tablet Take 1 tablet (4 mg total) by mouth every 8 (eight) hours as needed for nausea or vomiting. (Patient not taking: Reported on 12/25/2016) 15 tablet 0 Not Taking  . Prenatal Vit-Min-FA-Fish Oil ([employer] PRENATAL GUMMY PO) Take 1 tablet by mouth daily.   Taking  . Prenatal-DSS-FeCb-FeGl-FA (CITRANATAL BLOOM) 90-1 MG TABS Take 1 tablet by mouth daily before breakfast. 90 tablet 3   . promethazine (PHENERGAN) 25 MG tablet Take 1 tablet (25 mg total) by mouth every 6 (six) hours as needed for  nausea or vomiting. (Patient not taking: Reported on 02/11/2017) 30 tablet 2 Not Taking  . terconazole (TERAZOL 3) 0.8 % vaginal cream Place 1 applicator vaginally at bedtime. (Patient not taking: Reported on 02/11/2017) 20 g 0 Not Taking    Review of Systems  Constitutional: Negative.   Respiratory: Negative.   Cardiovascular: Negative.   Gastrointestinal: Negative.  Negative for abdominal pain, constipation, diarrhea and vomiting.  Genitourinary: Positive for vaginal discharge. Negative for dysuria and missed menses.  Musculoskeletal: Negative.   Neurological: Negative.    Physical Exam   Blood pressure 124/66, pulse 92, temperature 98 F (36.7 C),  temperature source Oral, resp. rate 17, weight 183 lb 1.9 oz (83.1 kg), SpO2 100 %.  Physical Exam  Constitutional: She is oriented to person, place, and time. She appears well-developed and well-nourished.  HENT:  Head: Normocephalic.  Respiratory: Effort normal.  GI: Soft.  Genitourinary:  Genitourinary Comments: NEFG;external vulva is completely dry.  Milky white discharge present in the vagina. No pooling, cervix is 1 cm, soft and posterior.   Musculoskeletal: Normal range of motion.  Neurological: She is alert and oriented to person, place, and time. She has normal reflexes.  Skin: Skin is warm and dry.  Psychiatric: She has a normal mood and affect.    MAU Course  Procedures  MDM -sterile fern negative and no pooling -FHR is 160 -unable to collect urine as patient doesn't feel like she can pee -wet prep: positive for clue Assessment and Plan   1. Bacterial vaginosis    2. Patient stable for discharge with RX for flagyl.  3. Return precautions given, patient verbalized understanding.   Charlesetta Garibaldi Jamen Loiseau 03/01/2017, 6:22 PM

## 2017-03-01 NOTE — Discharge Instructions (Signed)

## 2017-03-01 NOTE — Telephone Encounter (Signed)
Patient called complaining of leaking fluid.  Instructed patient to go immediately to Maternity Admissions.

## 2017-03-01 NOTE — MAU Note (Signed)
Since 130 pm today has noted LOF--clear  States went through her underwear once Denies any pain at this time.

## 2017-03-11 ENCOUNTER — Encounter: Payer: Medicaid Other | Admitting: Obstetrics

## 2017-03-17 ENCOUNTER — Ambulatory Visit: Payer: Medicaid Other | Admitting: Neurology

## 2017-03-17 ENCOUNTER — Encounter: Payer: Self-pay | Admitting: Neurology

## 2017-03-17 ENCOUNTER — Telehealth: Payer: Self-pay | Admitting: Neurology

## 2017-03-17 NOTE — Telephone Encounter (Signed)
This is the second new patient no show for this patient.  The patient will be discharged from our practice. 

## 2017-03-22 ENCOUNTER — Encounter (INDEPENDENT_AMBULATORY_CARE_PROVIDER_SITE_OTHER): Payer: Medicaid Other | Admitting: Obstetrics

## 2017-03-31 ENCOUNTER — Ambulatory Visit (INDEPENDENT_AMBULATORY_CARE_PROVIDER_SITE_OTHER): Payer: Medicaid Other | Admitting: Obstetrics

## 2017-03-31 ENCOUNTER — Encounter: Payer: Self-pay | Admitting: Obstetrics

## 2017-03-31 VITALS — BP 116/68 | HR 81 | Wt 185.2 lb

## 2017-03-31 DIAGNOSIS — J301 Allergic rhinitis due to pollen: Secondary | ICD-10-CM

## 2017-03-31 DIAGNOSIS — Z3490 Encounter for supervision of normal pregnancy, unspecified, unspecified trimester: Secondary | ICD-10-CM

## 2017-03-31 DIAGNOSIS — Z3482 Encounter for supervision of other normal pregnancy, second trimester: Secondary | ICD-10-CM

## 2017-03-31 MED ORDER — LORATADINE 10 MG PO TABS: 10.0000 mg | ORAL_TABLET | Freq: Every day | ORAL | 11 refills | Status: AC

## 2017-03-31 NOTE — Patient Instructions (Addendum)
Glucose Tolerance Test During Pregnancy The glucose tolerance test (GTT) is a blood test used to determine if you have developed a type of diabetes during pregnancy (gestational diabetes). This is when your body does not properly process sugar (glucose) in the food you eat, resulting in high blood glucose levels. Typically, a GTT is done after you have had a 1-hour glucose test with results that indicate you possibly have gestational diabetes. It may also be done if:  You have a history of giving birth to very large babies or have experienced repeated fetal loss (stillbirth).  You have signs and symptoms of diabetes, such as: ? Changes in your vision. ? Tingling or numbness in your hands or feet. ? Changes in hunger, thirst, and urination not otherwise explained by your pregnancy.  The GTT lasts about 3 hours. You will be given a sugar-water solution to drink at the beginning of the test. You will have blood drawn before you drink the solution and then again 1, 2, and 3 hours after you drink it. You will not be allowed to eat or drink anything else during the test. You must remain at the testing location to make sure that your blood is drawn on time. You should also avoid exercising during the test, because exercise can alter test results. How do I prepare for this test? Eat normally for 3 days prior to the GTT test, including having plenty of carbohydrate-rich foods. Do not eat or drink anything except water during the final 12 hours before the test. In addition, your health care provider may ask you to stop taking certain medicines before the test. What do the results mean? It is your responsibility to obtain your test results. Ask the lab or department performing the test when and how you will get your results. Contact your health care provider to discuss any questions you have about your results. Range of Normal Values Ranges for normal values may vary among different labs and hospitals. You  should always check with your health care provider after having lab work or other tests done to discuss whether your values are considered within normal limits. Normal levels of blood glucose are as follows:  Fasting: less than 105 mg/dL.  1 hour after drinking the solution: less than 190 mg/dL.  2 hours after drinking the solution: less than 165 mg/dL.  3 hours after drinking the solution: less than 145 mg/dL.  Some substances can interfere with GTT results. These may include:  Blood pressure and heart failure medicines, including beta blockers, furosemide, and thiazides.  Anti-inflammatory medicines, including aspirin.  Nicotine.  Some psychiatric medicines.  Meaning of Results Outside Normal Value Ranges GTT test results that are above normal values may indicate a number of health problems, such as:  Gestational diabetes.  Acute stress response.  Cushing syndrome.  Tumors such as pheochromocytoma or glucagonoma.  Long-term kidney problems.  Pancreatitis.  Hyperthyroidism.  Current infection.  Discuss your test results with your health care provider. He or she will use the results to make a diagnosis and determine a treatment plan that is right for you. This information is not intended to replace advice given to you by your health care provider. Make sure you discuss any questions you have with your health care provider. Document Released: 12/22/2011 Document Revised: 11/28/2015 Document Reviewed: 10/27/2013 Elsevier Interactive Patient Education  2017 [employer].  Nasal Allergies Nasal allergies are a reaction to allergens in the air. Allergens are particles in the air that cause  your body to have an allergic reaction. Nasal allergies are not passed from person to person (are not contagious). They cannot be cured, but they can be controlled. What are the causes? Seasonal nasal allergies (hay fever) are caused by pollen allergens that come from grasses, trees, and  weeds. Year-round nasal allergies (perennial allergic rhinitis) are caused by allergens such as house dust mites, pet dander, and mold spores. What increases the risk? The following factors may make you more likely to develop this condition:  Having certain health conditions. These include: ? Other types of allergies, such as food allergies. ? Asthma. ? Eczema.  Having a close relative who has allergies or asthma.  Exposure to house dust, pollen, dander, or other allergens at home or at work.  Exposure to air pollution or secondhand smoke when you were a child.  What are the signs or symptoms? Symptoms of this condition include:  Sneezing.  Runny nose or stuffy nose (congestion).  Watery (tearing) eyes.  Itchy eyes, nose, mouth, throat, skin, or other area.  Sore throat.  Headache.  Decreased sense of smell or taste.  Fatigue. This may occur if you have trouble sleeping due to allergies.  Swollen eyelids.  How is this diagnosed? This condition is diagnosed with a medical history and physical exam. Allergy testing may be done to determine exactly what triggers your nasal allergies. How is this treated? There is no cure for nasal allergies. Treatment focuses on controlling your symptoms, and it may include:  Medicines that block allergy symptoms. These may include allergy shots, nasal sprays, and oral antihistamines.  Avoiding the allergen.  Follow these instructions at home:  Avoid the allergen that is causing your symptoms, if possible.  Keep windows closed. If possible, use air conditioning when pollen counts are high.  Do not use fans in your home.  Do not hang clothes outside to dry.  Wear sunglasses to keep pollen out of your eyes.  Wash your hands right away after you touch household pets.  Take over-the-counter and prescription medicines only as told by your health care provider.  Keep all follow-up visits as told by your health care provider. This  is important. Contact a health care provider if:  You have a fever.  You develop a cough that does not go away (is persistent).  You start to wheeze.  Your symptoms do not improve with treatment.  You have thick nasal discharge.  You start to have nosebleeds. Get help right away if:  Your tongue or your lips are swollen.  You have trouble breathing.  You feel light-headed or you feel like you are going to faint.  You have cold sweats. This information is not intended to replace advice given to you by your health care provider. Make sure you discuss any questions you have with your health care provider. Document Released: 06/22/2005 Document Revised: 11/25/2015 Document Reviewed: 01/02/2015 Elsevier Interactive Patient Education  [employer].

## 2017-03-31 NOTE — Progress Notes (Signed)
Subjective:  Erin Hines is a 26 y.o. [phone] at [redacted]w[redacted]d being seen today for ongoing prenatal care.  She is currently monitored for the following issues for this low-risk pregnancy and has Encounter for suprvsn of normal pregnancy, unsp trimester; LGSIL on Pap smear of cervix; and Asymptomatic bacteriuria during pregnancy on her problem list.  Patient reports nasal allergies - seasonal.  Contractions: Not present. Vag. Bleeding: None.  Movement: Present. Denies leaking of fluid.   The following portions of the patient's history were reviewed and updated as appropriate: allergies, current medications, past family history, past medical history, past social history, past surgical history and problem list. Problem list updated.  Objective:   Vitals:   03/31/17 0956  BP: 116/68  Pulse: 81  Weight: 185 lb 3.2 oz (84 kg)    Fetal Status: Fetal Heart Rate (bpm): 150   Movement: Present     General:  Alert, oriented and cooperative. Patient is in no acute distress.  Skin: Skin is warm and dry. No rash noted.   Cardiovascular: Normal heart rate noted  Respiratory: Normal respiratory effort, no problems with respiration noted  Abdomen: Soft, gravid, appropriate for gestational age. Pain/Pressure: Present     Pelvic:  Cervical exam deferred        Extremities: Normal range of motion.  Edema: None  Mental Status: Normal mood and affect. Normal behavior. Normal judgment and thought content.   Urinalysis:      Assessment and Plan:  Pregnancy: [id number] at [redacted]w[redacted]d  There are no diagnoses linked to this encounter. Preterm labor symptoms and general obstetric precautions including but not limited to vaginal bleeding, contractions, leaking of fluid and fetal movement were reviewed in detail with the patient. Please refer to After Visit Summary for other counseling recommendations.  Return in about 2 weeks (around 04/14/2017) for ROB,  2 hour OGTT.  Sign Tubal Papers.Clearance Coots, Bing Neighbors, MD

## 2017-04-15 ENCOUNTER — Inpatient Hospital Stay (HOSPITAL_COMMUNITY)
Admission: AD | Admit: 2017-04-15 | Discharge: 2017-04-15 | Discharge status: Against Medical Advice | Payer: Medicaid Other | Source: Ambulatory Visit | Attending: Family Medicine | Admitting: Family Medicine

## 2017-04-15 DIAGNOSIS — Z5321 Procedure and treatment not carried out due to patient leaving prior to being seen by health care provider: Secondary | ICD-10-CM | POA: Insufficient documentation

## 2017-04-15 LAB — URINALYSIS, ROUTINE W REFLEX MICROSCOPIC
BILIRUBIN URINE: NEGATIVE
Bacteria, UA: NONE SEEN
GLUCOSE, UA: NEGATIVE mg/dL
Hgb urine dipstick: NEGATIVE
KETONES UR: NEGATIVE mg/dL
Nitrite: NEGATIVE
PH: 7 (ref 5.0–8.0)
Protein, ur: NEGATIVE mg/dL
SPECIFIC GRAVITY, URINE: 1.014 (ref 1.005–1.030)

## 2017-04-15 NOTE — MAU Note (Signed)
Pt reports she has noticed increased vaginal swelling and felt like "lumps" inside her vagina. Also c/o constipation.

## 2017-04-15 NOTE — MAU Note (Signed)
pt has to pick kids up. Has Appointment tommorow will get it checked out then.

## 2017-04-16 ENCOUNTER — Other Ambulatory Visit (HOSPITAL_COMMUNITY)
Admission: RE | Admit: 2017-04-16 | Discharge: 2017-04-16 | Disposition: A | Discharge status: Home / Self Care | Payer: Medicaid Other | Source: Ambulatory Visit | Attending: Obstetrics | Admitting: Obstetrics

## 2017-04-16 ENCOUNTER — Ambulatory Visit (INDEPENDENT_AMBULATORY_CARE_PROVIDER_SITE_OTHER): Payer: Medicaid Other | Admitting: Obstetrics

## 2017-04-16 ENCOUNTER — Other Ambulatory Visit: Payer: Medicaid Other

## 2017-04-16 ENCOUNTER — Encounter: Payer: Self-pay | Admitting: Obstetrics

## 2017-04-16 VITALS — BP 114/68 | HR 85 | Wt 185.0 lb

## 2017-04-16 DIAGNOSIS — Z3493 Encounter for supervision of normal pregnancy, unspecified, third trimester: Secondary | ICD-10-CM | POA: Insufficient documentation

## 2017-04-16 DIAGNOSIS — B373 Candidiasis of vulva and vagina: Secondary | ICD-10-CM

## 2017-04-16 DIAGNOSIS — Z3490 Encounter for supervision of normal pregnancy, unspecified, unspecified trimester: Secondary | ICD-10-CM

## 2017-04-16 DIAGNOSIS — B3731 Acute candidiasis of vulva and vagina: Secondary | ICD-10-CM

## 2017-04-16 MED ORDER — FLUCONAZOLE 150 MG PO TABS: 150.0000 mg | ORAL_TABLET | Freq: Once | ORAL | 0 refills | Status: AC

## 2017-04-16 NOTE — Progress Notes (Signed)
Patient will get FLU & TDAP at [facility] (Employer).  BTL signed. Vagina is swollen and irritated, she is having lower back pain and vomiting 3x/day for 4 days now.

## 2017-04-16 NOTE — Progress Notes (Signed)
Subjective:  Erin Hines is a 26 y.o. [phone] at [redacted]w[redacted]d being seen today for ongoing prenatal care.  She is currently monitored for the following issues for this low-risk pregnancy and has Encounter for suprvsn of normal pregnancy, unsp trimester; LGSIL on Pap smear of cervix; and Asymptomatic bacteriuria during pregnancy on her problem list.  Patient reports backache, fatigue, nausea and vomiting.  Contractions: Not present. Vag. Bleeding: None.  Movement: Present. Denies leaking of fluid.   The following portions of the patient's history were reviewed and updated as appropriate: allergies, current medications, past family history, past medical history, past social history, past surgical history and problem list. Problem list updated.  Objective:   Vitals:   04/16/17 0921  BP: 114/68  Pulse: 85  Weight: 185 lb (83.9 kg)    Fetal Status: Fetal Heart Rate (bpm): 150   Movement: Present     General:  Alert, oriented and cooperative. Patient is in no acute distress.  Skin: Skin is warm and dry. No rash noted.   Cardiovascular: Normal heart rate noted  Respiratory: Normal respiratory effort, no problems with respiration noted  Abdomen: Soft, gravid, appropriate for gestational age. Pain/Pressure: Present     Pelvic:  Labial edema and erythema        Extremities: Normal range of motion.  Edema: None  Mental Status: Normal mood and affect. Normal behavior. Normal judgment and thought content.   Urinalysis:      Assessment and Plan:  Pregnancy: [id number] at [redacted]w[redacted]d  1. Encounter for suprvsn of normal pregnancy, unsp trimester Rx: - Glucose Tolerance, 2 Hours w/1 Hour - CBC - HIV antibody (with reflex) - RPR - Cervicovaginal ancillary only  2. Candida vaginitis Rx: - fluconazole (DIFLUCAN) 150 MG tablet; Take 1 tablet (150 mg total) by mouth once.  Dispense: 1 tablet; Refill: 0  3. Nausea, vomiting and backache - patient not able to perform duties at work because of discomfort -  recommend early maternity leave and modified bedrest  Preterm labor symptoms and general obstetric precautions including but not limited to vaginal bleeding, contractions, leaking of fluid and fetal movement were reviewed in detail with the patient. Please refer to After Visit Summary for other counseling recommendations.  Return in about 2 weeks (around 04/30/2017) for ROB.   Brock Bad, MD

## 2017-04-18 LAB — RPR: RPR: NONREACTIVE

## 2017-04-18 LAB — CBC
HEMATOCRIT: 29 % — AB (ref 34.0–46.6)
Hemoglobin: 9 g/dL — ABNORMAL LOW (ref 11.1–15.9)
MCH: 24.3 pg — AB (ref 26.6–33.0)
MCHC: 31 g/dL — AB (ref 31.5–35.7)
MCV: 78 fL — ABNORMAL LOW (ref 79–97)
PLATELETS: 200 10*3/uL (ref 150–379)
RBC: 3.7 x10E6/uL — ABNORMAL LOW (ref 3.77–5.28)
RDW: 14.6 % (ref 12.3–15.4)
WBC: 8.8 10*3/uL (ref 3.4–10.8)

## 2017-04-18 LAB — GLUCOSE TOLERANCE, 2 HOURS W/ 1HR
Glucose, 1 hour: 81 mg/dL (ref 65–179)
Glucose, 2 hour: 90 mg/dL (ref 65–152)
Glucose, Fasting: 79 mg/dL (ref 65–91)

## 2017-04-18 LAB — HIV ANTIBODY (ROUTINE TESTING W REFLEX): HIV SCREEN 4TH GENERATION: NONREACTIVE

## 2017-04-19 ENCOUNTER — Other Ambulatory Visit: Payer: Self-pay | Admitting: Obstetrics

## 2017-04-19 DIAGNOSIS — Z3483 Encounter for supervision of other normal pregnancy, third trimester: Secondary | ICD-10-CM

## 2017-04-20 ENCOUNTER — Other Ambulatory Visit: Payer: Self-pay | Admitting: Obstetrics

## 2017-04-20 DIAGNOSIS — O99019 Anemia complicating pregnancy, unspecified trimester: Secondary | ICD-10-CM

## 2017-04-20 LAB — CERVICOVAGINAL ANCILLARY ONLY
BACTERIAL VAGINITIS: POSITIVE — AB
CANDIDA VAGINITIS: POSITIVE — AB
Chlamydia: NEGATIVE
NEISSERIA GONORRHEA: NEGATIVE
Trichomonas: NEGATIVE

## 2017-04-20 MED ORDER — FERROUS SULFATE 325 (65 FE) MG PO TABS: 325.0000 mg | ORAL_TABLET | Freq: Two times a day (BID) | ORAL | 5 refills | Status: AC

## 2017-04-20 NOTE — Progress Notes (Signed)
Patient notified via MyChart 04/20/17

## 2017-04-21 ENCOUNTER — Telehealth: Payer: Self-pay

## 2017-04-21 ENCOUNTER — Other Ambulatory Visit: Payer: Self-pay | Admitting: Obstetrics

## 2017-04-21 DIAGNOSIS — B373 Candidiasis of vulva and vagina: Secondary | ICD-10-CM

## 2017-04-21 DIAGNOSIS — B9689 Other specified bacterial agents as the cause of diseases classified elsewhere: Secondary | ICD-10-CM

## 2017-04-21 DIAGNOSIS — N760 Acute vaginitis: Principal | ICD-10-CM

## 2017-04-21 DIAGNOSIS — B3731 Acute candidiasis of vulva and vagina: Secondary | ICD-10-CM

## 2017-04-21 MED ORDER — FLUCONAZOLE 150 MG PO TABS: 150.0000 mg | ORAL_TABLET | Freq: Once | ORAL | 0 refills | Status: AC

## 2017-04-21 MED ORDER — SECNIDAZOLE 2 G PO PACK: 1.0000 | PACK | Freq: Once | ORAL | 2 refills | Status: AC

## 2017-04-21 NOTE — Telephone Encounter (Signed)
Patient notified of results and RX 

## 2017-04-21 NOTE — Telephone Encounter (Signed)
-----   Message from Brock Badharles A Harper, MD sent at 04/21/2017  2:08 PM EDT ----- Solosec Rx for BV Diflucan Rx for yeast

## 2017-04-30 ENCOUNTER — Encounter: Payer: Medicaid Other | Admitting: Obstetrics

## 2017-05-14 ENCOUNTER — Ambulatory Visit (INDEPENDENT_AMBULATORY_CARE_PROVIDER_SITE_OTHER): Payer: Medicaid Other | Admitting: Obstetrics

## 2017-05-14 ENCOUNTER — Other Ambulatory Visit (HOSPITAL_COMMUNITY)
Admission: RE | Admit: 2017-05-14 | Discharge: 2017-05-14 | Disposition: A | Discharge status: Home / Self Care | Payer: Medicaid Other | Source: Ambulatory Visit | Attending: Obstetrics | Admitting: Obstetrics

## 2017-05-14 ENCOUNTER — Encounter: Payer: Self-pay | Admitting: Obstetrics

## 2017-05-14 ENCOUNTER — Other Ambulatory Visit: Payer: Self-pay

## 2017-05-14 VITALS — BP 114/69 | HR 81 | Wt 191.6 lb

## 2017-05-14 DIAGNOSIS — Z3490 Encounter for supervision of normal pregnancy, unspecified, unspecified trimester: Secondary | ICD-10-CM

## 2017-05-14 DIAGNOSIS — B373 Candidiasis of vulva and vagina: Secondary | ICD-10-CM

## 2017-05-14 DIAGNOSIS — Z8669 Personal history of other diseases of the nervous system and sense organs: Secondary | ICD-10-CM

## 2017-05-14 DIAGNOSIS — O210 Mild hyperemesis gravidarum: Secondary | ICD-10-CM

## 2017-05-14 DIAGNOSIS — N898 Other specified noninflammatory disorders of vagina: Secondary | ICD-10-CM | POA: Insufficient documentation

## 2017-05-14 DIAGNOSIS — B3731 Acute candidiasis of vulva and vagina: Secondary | ICD-10-CM

## 2017-05-14 MED ORDER — BUTALBITAL-APAP-CAFFEINE 50-325-40 MG PO TABS: 2.0000 | ORAL_TABLET | Freq: Four times a day (QID) | ORAL | 2 refills | Status: AC | PRN

## 2017-05-14 MED ORDER — FLUCONAZOLE 150 MG PO TABS: 150.0000 mg | ORAL_TABLET | Freq: Once | ORAL | 0 refills | Status: AC

## 2017-05-14 NOTE — Addendum Note (Signed)
Addended by: Dalphine HandingGARDNER, Ventura Hollenbeck L on: 05/14/2017 10:07 AM   Modules accepted: Orders

## 2017-05-14 NOTE — Progress Notes (Signed)
Subjective:  Erin Hines is a [age] y.o. [phone] at 6866w1d being seen today for ongoing prenatal care.  She is currently monitored for the following issues for this low-risk pregnancy and has Encounter for suprvsn of normal pregnancy, unsp trimester; LGSIL on Pap smear of cervix; and Asymptomatic bacteriuria during pregnancy on their problem list.  Patient reports vaginal irritation and cottage cheese vaginal discharge.  Contractions: Not present. Vag. Bleeding: None.  Movement: Present. Denies leaking of fluid.   The following portions of the patient's history were reviewed and updated as appropriate: allergies, current medications, past family history, past medical history, past social history, past surgical history and problem list. Problem list updated.  Objective:   Vitals:   05/14/17 0918  BP: 114/69  Pulse: 81  Weight: 191 lb 9.6 oz (86.9 kg)    Fetal Status:     Movement: Present     General:  Alert, oriented and cooperative. Patient is in no acute distress.  Skin: Skin is warm and dry. No rash noted.   Cardiovascular: Normal heart rate noted  Respiratory: Normal respiratory effort, no problems with respiration noted  Abdomen: Soft, gravid, appropriate for gestational age. Pain/Pressure: Absent     Pelvic:  Cervical exam deferred        Extremities: Normal range of motion.  Edema: None  Mental Status: Normal mood and affect. Normal behavior. Normal judgment and thought content.   Urinalysis:      Assessment and Plan:  Pregnancy: [id number] at 2066w1d  1. Encounter for suprvsn of normal pregnancy, unsp trimester  2. Hyperemesis gravidarum, antepartum - stable  3. Candida vaginitis Rx: - fluconazole (DIFLUCAN) 150 MG tablet; Take 1 tablet (150 mg total) once for 1 dose by mouth.  Dispense: 1 tablet; Refill: 0  4. Hx of migraines - Fioricet prn  Preterm labor symptoms and general obstetric precautions including but not limited to vaginal bleeding, contractions, leaking of  fluid and fetal movement were reviewed in detail with the patient. Please refer to After Visit Summary for other counseling recommendations.  Return in about 2 weeks (around 05/28/2017).   Brock BadHarper, Jaedan Huttner A, MD

## 2017-05-14 NOTE — Progress Notes (Signed)
Complains of vomiting every time she eats x 21 days, gets a headache after vomiting 9/10 lasting all day.  Cottage cheese discharge. Denies odor, pain.

## 2017-05-17 LAB — URINE CYTOLOGY ANCILLARY ONLY: Trichomonas: NEGATIVE

## 2017-05-19 ENCOUNTER — Other Ambulatory Visit: Payer: Self-pay | Admitting: Obstetrics

## 2017-05-19 DIAGNOSIS — N760 Acute vaginitis: Principal | ICD-10-CM

## 2017-05-19 DIAGNOSIS — B3731 Acute candidiasis of vulva and vagina: Secondary | ICD-10-CM

## 2017-05-19 DIAGNOSIS — B9689 Other specified bacterial agents as the cause of diseases classified elsewhere: Secondary | ICD-10-CM

## 2017-05-19 DIAGNOSIS — B373 Candidiasis of vulva and vagina: Secondary | ICD-10-CM

## 2017-05-19 LAB — URINE CYTOLOGY ANCILLARY ONLY: CANDIDA VAGINITIS: POSITIVE — AB

## 2017-05-19 MED ORDER — FLUCONAZOLE 150 MG PO TABS: 150.0000 mg | ORAL_TABLET | Freq: Once | ORAL | 0 refills | Status: AC

## 2017-05-19 MED ORDER — SECNIDAZOLE 2 G PO PACK: 1.0000 | PACK | Freq: Once | ORAL | 2 refills | Status: AC

## 2017-06-01 ENCOUNTER — Ambulatory Visit (INDEPENDENT_AMBULATORY_CARE_PROVIDER_SITE_OTHER): Payer: Medicaid Other | Admitting: Obstetrics

## 2017-06-01 ENCOUNTER — Encounter: Payer: Self-pay | Admitting: Obstetrics

## 2017-06-01 ENCOUNTER — Other Ambulatory Visit (HOSPITAL_COMMUNITY)
Admission: RE | Admit: 2017-06-01 | Discharge: 2017-06-01 | Disposition: A | Discharge status: Home / Self Care | Payer: Medicaid Other | Source: Ambulatory Visit | Attending: Obstetrics | Admitting: Obstetrics

## 2017-06-01 VITALS — BP 126/72 | HR 81 | Wt 187.4 lb

## 2017-06-01 DIAGNOSIS — N898 Other specified noninflammatory disorders of vagina: Secondary | ICD-10-CM | POA: Insufficient documentation

## 2017-06-01 DIAGNOSIS — Z3490 Encounter for supervision of normal pregnancy, unspecified, unspecified trimester: Secondary | ICD-10-CM | POA: Diagnosis present

## 2017-06-01 DIAGNOSIS — Z3483 Encounter for supervision of other normal pregnancy, third trimester: Secondary | ICD-10-CM

## 2017-06-01 NOTE — Progress Notes (Signed)
Subjective:  Erin Hines is a 26 y.o. [phone] at 667w5d being seen today for ongoing prenatal care.  She is currently monitored for the following issues for this low-risk pregnancy and has Encounter for suprvsn of normal pregnancy, unsp trimester; LGSIL on Pap smear of cervix; and Asymptomatic bacteriuria during pregnancy on their problem list.  Patient reports backache.  Contractions: Not present. Vag. Bleeding: None.  Movement: Present. Denies leaking of fluid.   The following portions of the patient's history were reviewed and updated as appropriate: allergies, current medications, past family history, past medical history, past social history, past surgical history and problem list. Problem list updated.  Objective:   Vitals:   06/01/17 1133  BP: 126/72  Pulse: 81  Weight: 187 lb 6.4 oz (85 kg)    Fetal Status: Fetal Heart Rate (bpm): 150   Movement: Present     General:  Alert, oriented and cooperative. Patient is in no acute distress.  Skin: Skin is warm and dry. No rash noted.   Cardiovascular: Normal heart rate noted  Respiratory: Normal respiratory effort, no problems with respiration noted  Abdomen: Soft, gravid, appropriate for gestational age. Pain/Pressure: Present     Pelvic:  Cervical exam deferred        Extremities: Normal range of motion.  Edema: None  Mental Status: Normal mood and affect. Normal behavior. Normal judgment and thought content.   Urinalysis:      Assessment and Plan:  Pregnancy: [id number] at 887w5d  1. Vaginal discharge Rx: - Cervicovaginal ancillary only  2. Encounter for suprvsn of normal pregnancy, unsp trimester Rx: - Strep Gp B NAA  Term labor symptoms and general obstetric precautions including but not limited to vaginal bleeding, contractions, leaking of fluid and fetal movement were reviewed in detail with the patient. Please refer to After Visit Summary for other counseling recommendations.  Return in about 1 week (around 06/08/2017)  for ROB.   Brock BadHarper, Charles A, MD

## 2017-06-02 LAB — CERVICOVAGINAL ANCILLARY ONLY
BACTERIAL VAGINITIS: NEGATIVE
Candida vaginitis: NEGATIVE
Chlamydia: NEGATIVE
Neisseria Gonorrhea: NEGATIVE
TRICH (WINDOWPATH): NEGATIVE

## 2017-06-03 LAB — STREP GP B NAA: STREP GROUP B AG: NEGATIVE

## 2017-06-10 ENCOUNTER — Encounter: Payer: Medicaid Other | Admitting: Obstetrics

## 2017-06-14 ENCOUNTER — Encounter: Payer: Medicaid Other | Admitting: Obstetrics

## 2017-06-25 ENCOUNTER — Encounter: Payer: Self-pay | Admitting: Obstetrics

## 2017-06-25 ENCOUNTER — Ambulatory Visit (INDEPENDENT_AMBULATORY_CARE_PROVIDER_SITE_OTHER): Payer: Medicaid Other | Admitting: Obstetrics

## 2017-06-25 DIAGNOSIS — Z3490 Encounter for supervision of normal pregnancy, unspecified, unspecified trimester: Secondary | ICD-10-CM

## 2017-06-25 DIAGNOSIS — Z3493 Encounter for supervision of normal pregnancy, unspecified, third trimester: Secondary | ICD-10-CM

## 2017-06-25 NOTE — Progress Notes (Signed)
Pt went to hospital 06/23/17 pt states she was 4 cm. And 25% thinned .

## 2017-06-25 NOTE — Progress Notes (Signed)
Subjective:  Erin Hines is a 26 y.o. [phone] at 40101w1d being seen today for ongoing prenatal care.  She is currently monitored for the following issues for this low-risk pregnancy and has Encounter for suprvsn of normal pregnancy, unsp trimester; LGSIL on Pap smear of cervix; and Asymptomatic bacteriuria during pregnancy on their problem list.  Patient reports occasional contractions.  Contractions: Irregular. Vag. Bleeding: None.  Movement: Present. Denies leaking of fluid.   The following portions of the patient's history were reviewed and updated as appropriate: allergies, current medications, past family history, past medical history, past social history, past surgical history and problem list. Problem list updated.  Objective:   Vitals:   06/25/17 1117  BP: 119/67  Pulse: 88  Weight: 192 lb (87.1 kg)    Fetal Status: Fetal Heart Rate (bpm): 150   Movement: Present     General:  Alert, oriented and cooperative. Patient is in no acute distress.  Skin: Skin is warm and dry. No rash noted.   Cardiovascular: Normal heart rate noted  Respiratory: Normal respiratory effort, no problems with respiration noted  Abdomen: Soft, gravid, appropriate for gestational age. Pain/Pressure: Present     Pelvic:  Cervical exam deferred        Extremities: Normal range of motion.  Edema: None  Mental Status: Normal mood and affect. Normal behavior. Normal judgment and thought content.   Urinalysis:      Assessment and Plan:  Pregnancy: [id number] at 22101w1d  1. Encounter for suprvsn of normal pregnancy, unsp trimester   Term labor symptoms and general obstetric precautions including but not limited to vaginal bleeding, contractions, leaking of fluid and fetal movement were reviewed in detail with the patient. Please refer to After Visit Summary for other counseling recommendations.  Return in about 1 week (around 07/02/2017) for ROB.   Brock BadHarper, Serigne Kubicek A, MD

## 2017-07-01 ENCOUNTER — Telehealth: Payer: Self-pay | Admitting: Pediatrics

## 2017-07-01 NOTE — Telephone Encounter (Signed)
Pt states she gave birth 06/28/17 and is having low back pain, feels like pinched nerve.  She is requesting rx for muscle relaxer be sent to pharmacy.  She admits to having epidural during delivery.

## 2017-07-02 ENCOUNTER — Encounter: Payer: Medicaid Other | Admitting: Obstetrics

## 2017-07-02 ENCOUNTER — Other Ambulatory Visit: Payer: Self-pay | Admitting: Obstetrics

## 2017-07-02 DIAGNOSIS — M545 Low back pain, unspecified: Secondary | ICD-10-CM

## 2017-07-02 MED ORDER — CYCLOBENZAPRINE HCL 10 MG PO TABS: 10.0000 mg | ORAL_TABLET | Freq: Three times a day (TID) | ORAL | 1 refills | Status: AC | PRN

## 2017-07-02 MED ORDER — IBUPROFEN 800 MG PO TABS: 800.0000 mg | ORAL_TABLET | Freq: Three times a day (TID) | ORAL | 5 refills | Status: AC | PRN

## 2017-07-02 NOTE — Telephone Encounter (Signed)
Flexeril Rx for backache Ibuprofen Rx for backache

## 2017-07-09 NOTE — Telephone Encounter (Signed)
Pt advised.

## 2017-07-28 ENCOUNTER — Ambulatory Visit: Payer: Medicaid Other | Admitting: Obstetrics

## 2017-09-23 ENCOUNTER — Encounter (HOSPITAL_COMMUNITY): Payer: Self-pay

## 2018-05-22 IMAGING — US US OB COMP LESS 14 WK
1 series · 15 of 28 positions shown · non-contrast
Comparison: None.

CLINICAL DATA: Unknown LMP.

EXAM:
OBSTETRIC <14 WK ULTRASOUND
TECHNIQUE: Transabdominal ultrasound was performed for evaluation of the
gestation as well as the maternal uterus and adnexal regions.

[Series 1: us ob comp less 14 wk · 15 of 29 slices shown]
[im 1/29]
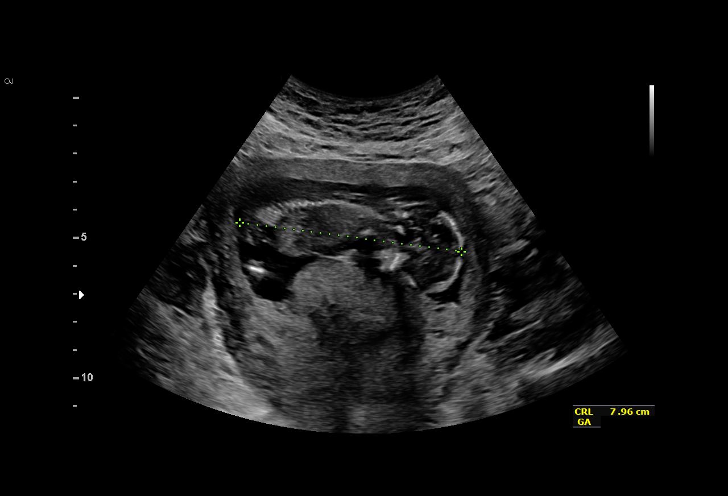
[im 3/29]
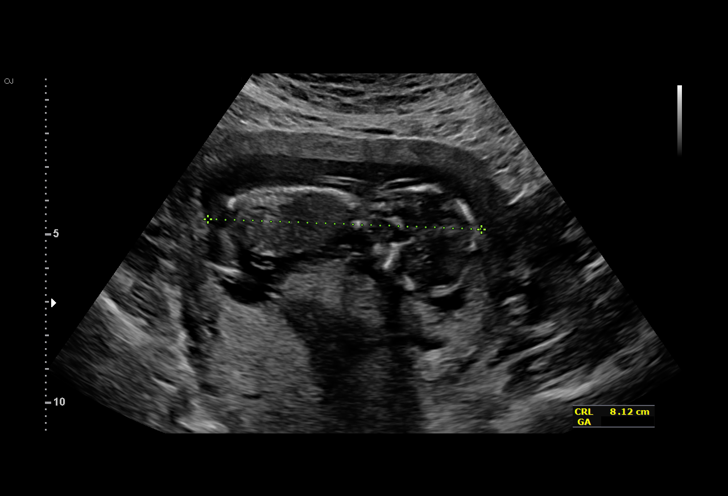
[im 5/29]
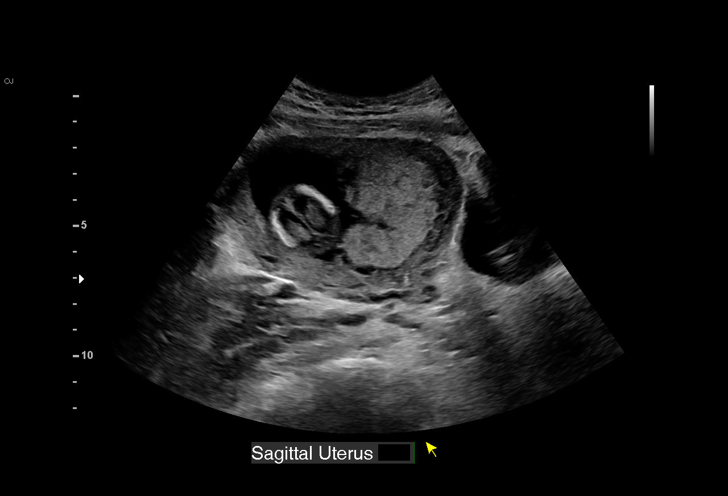
[im 7/29]
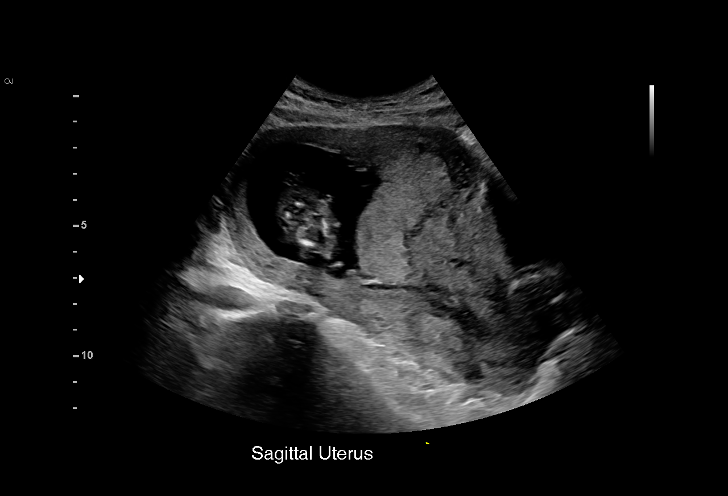
[im 9/29]
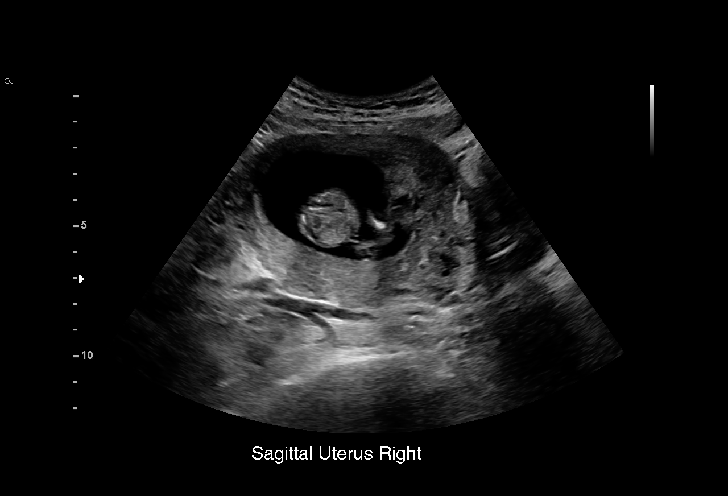
[im 11/29]
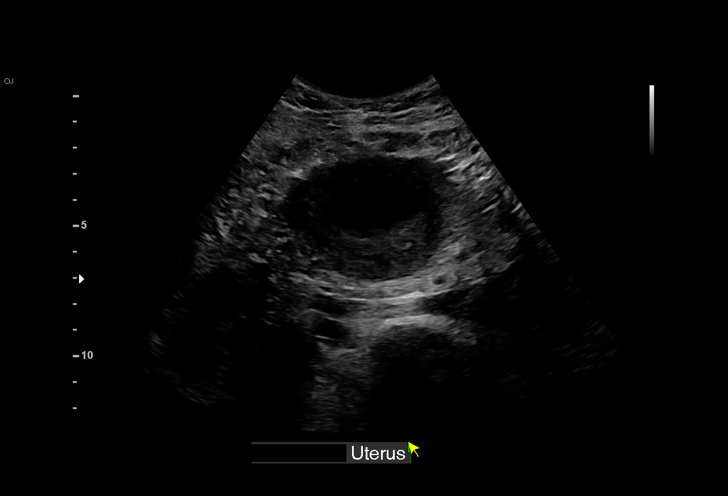
[im 13/29]
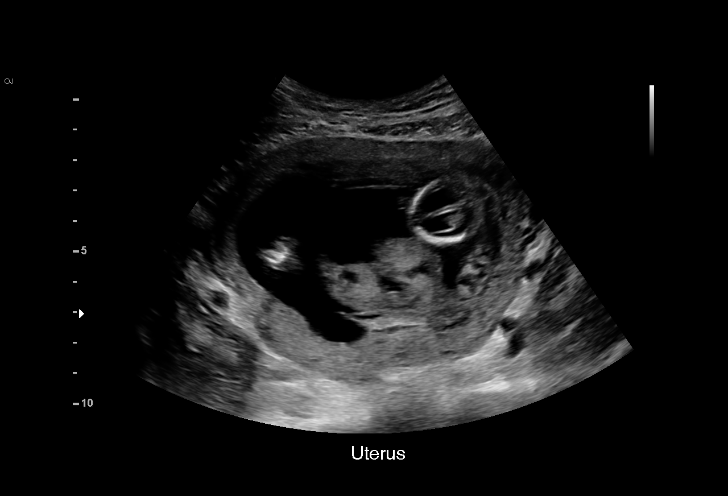
[im 15/29]
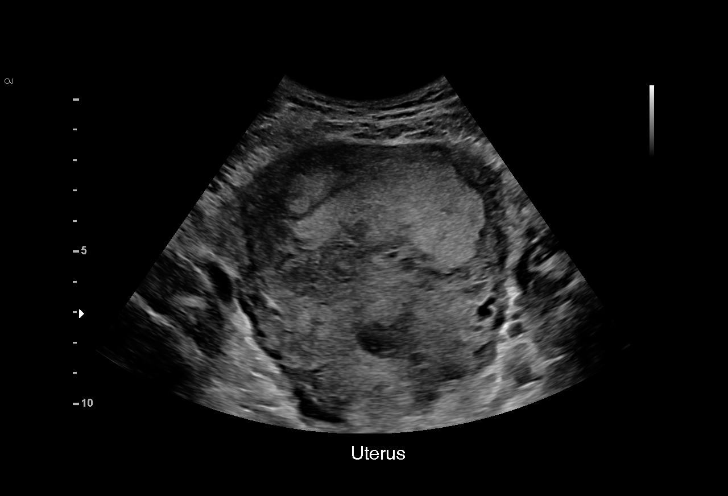
[im 16/29]
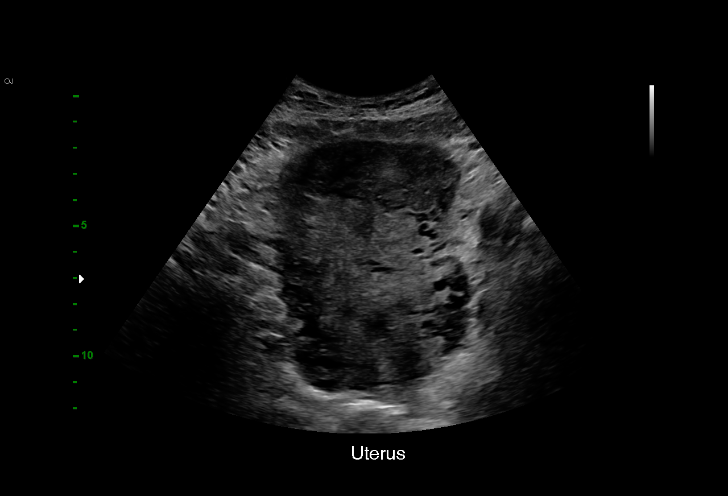
[im 18/29]
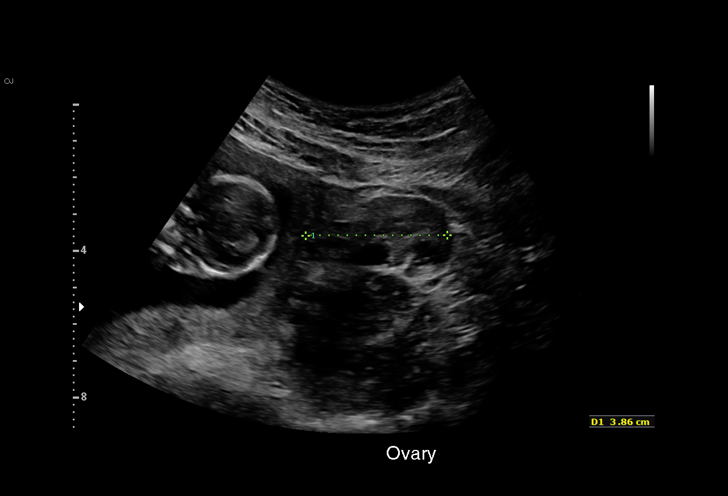
[im 20/29]
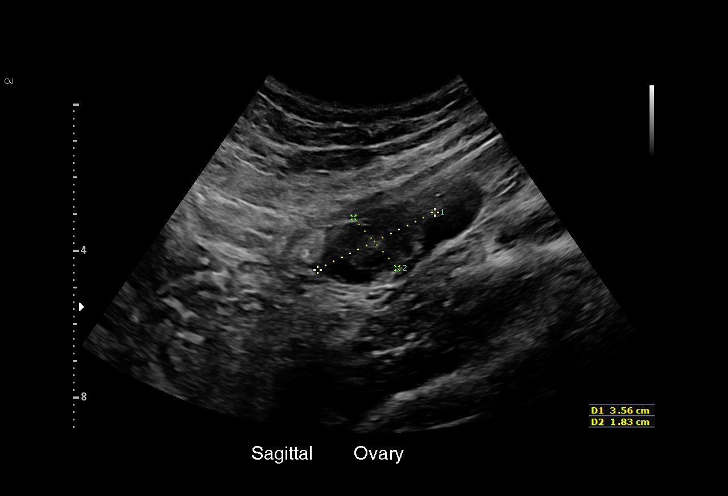
[im 22/29]
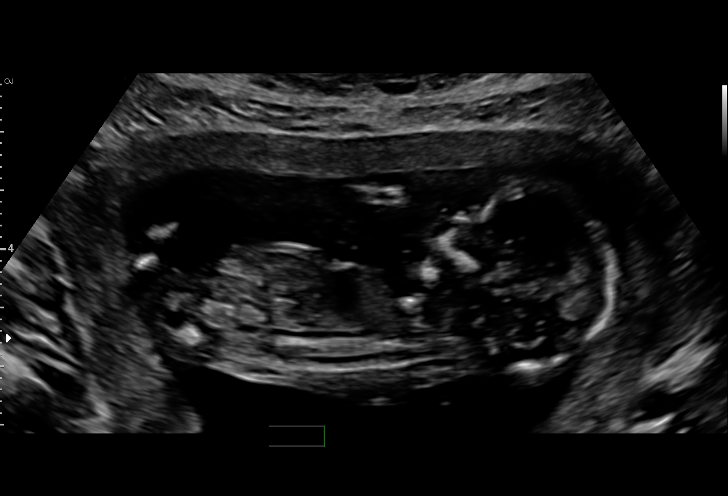
[im 24/29]
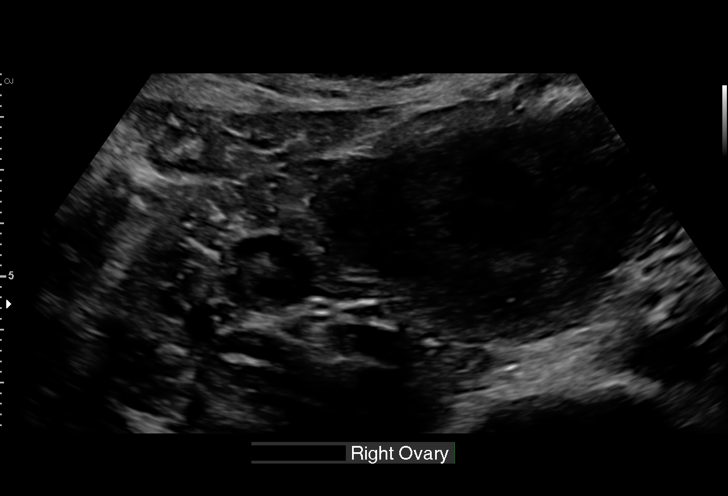
[im 26/29]
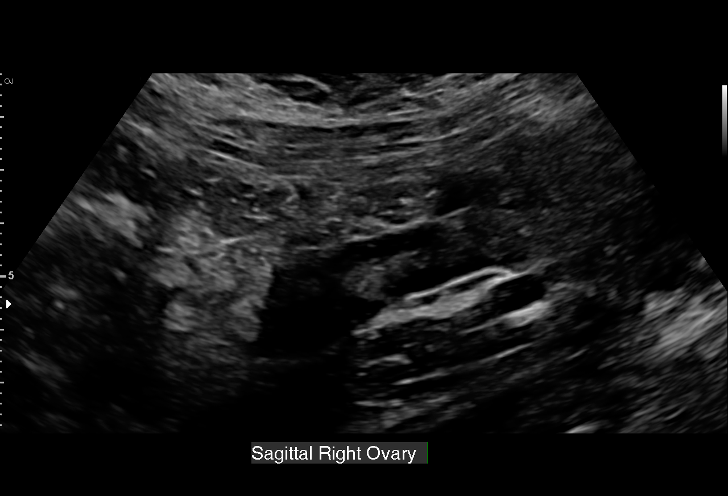
[im 29/29]
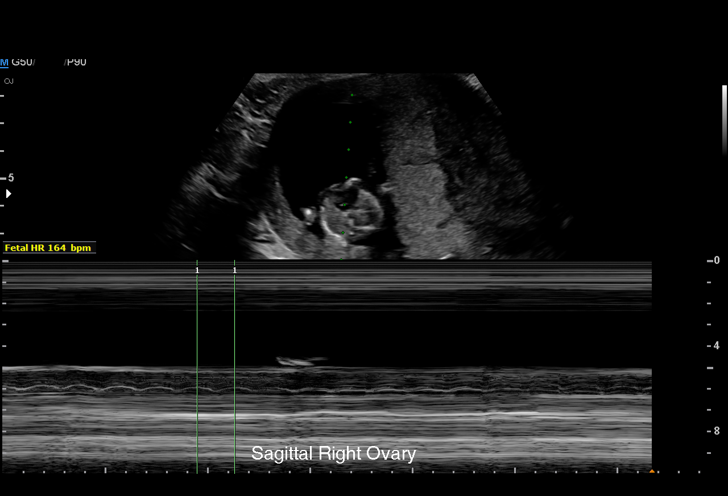

[15 of 28 positions shown; findings below may reference images not displayed]

FINDINGS: Intrauterine gestational sac: Single

Yolk sac:  Not Visualized.

Embryo:  Visualized.

Cardiac Activity: Visualized.

Heart Rate: 164 bpm

CRL:   80  mm   13 w 6 d                  US EDC: 07/03/2017

Subchorionic hemorrhage:  None visualized.

Maternal uterus/adnexae: Normal appearance of both ovaries. No mass
or abnormal free fluid identified.
IMPRESSION: Single living IUP measuring 13 weeks 6 days, with US EDC of
07/03/2017.

No significant maternal uterine or adnexal abnormality identified.

## 2018-07-03 IMAGING — US US MFM OB COMP +14 WKS
1 series · 14 of 28 positions shown · non-contrast
Comparison: none

[Series 1: us mfm ob comp +14 wks · 70 acquisitions, 14 frames shown]
[im 3/70]
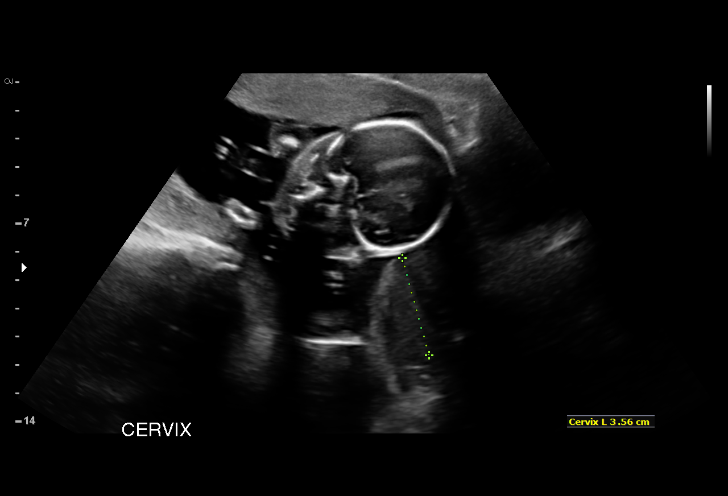
[im 8/70]
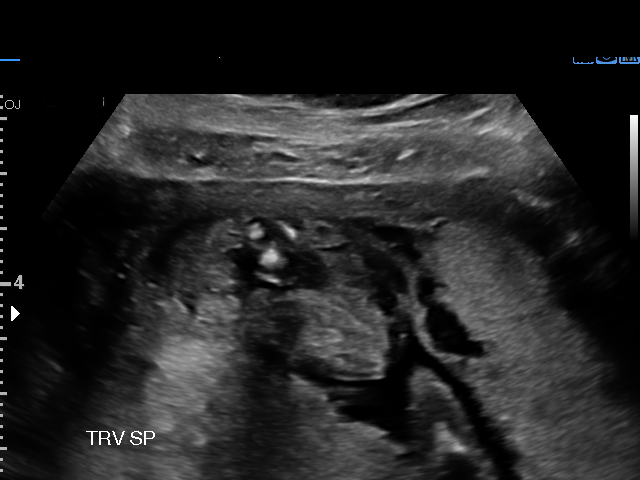
[im 13/70]
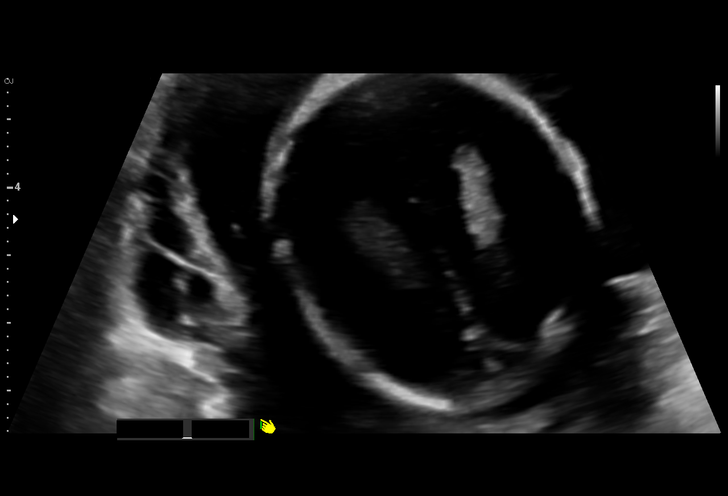
[im 18/70]
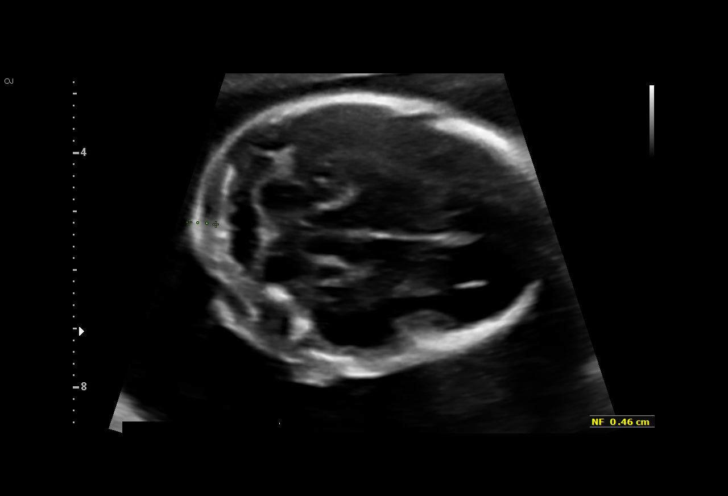
[im 24/70]
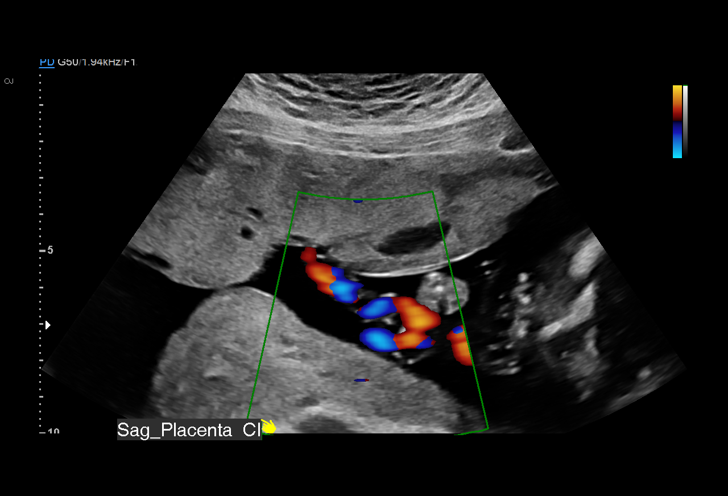
[im 29/70]
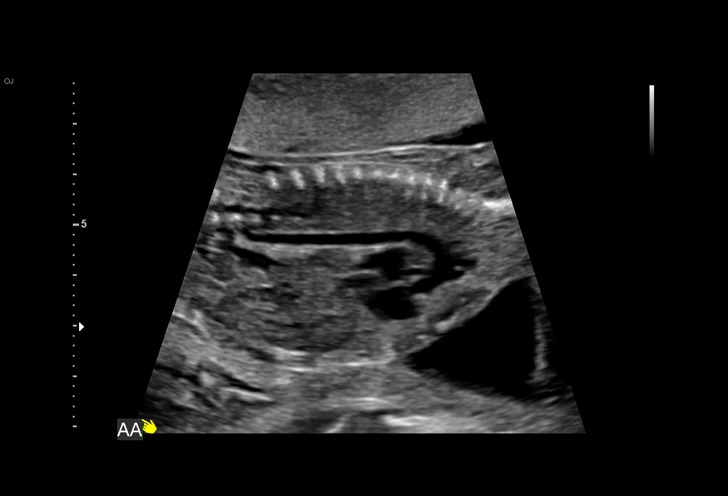
[im 34/70]
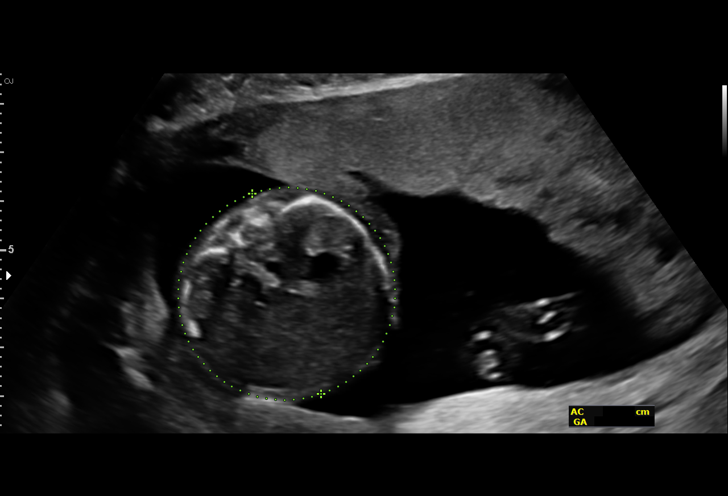
[im 39/70]
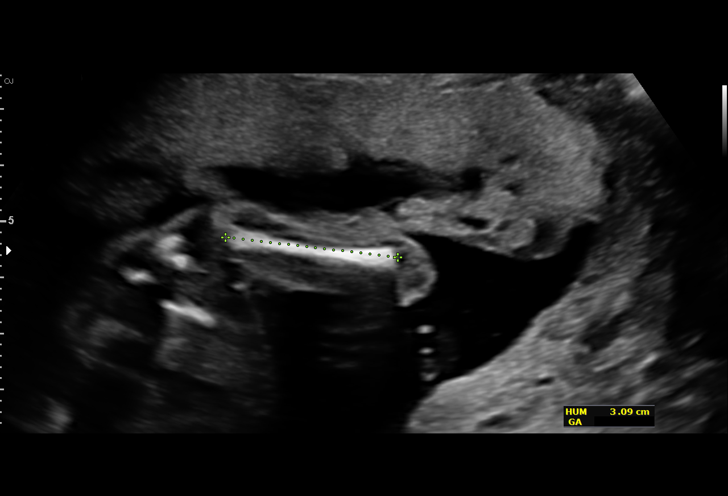
[im 44/70]
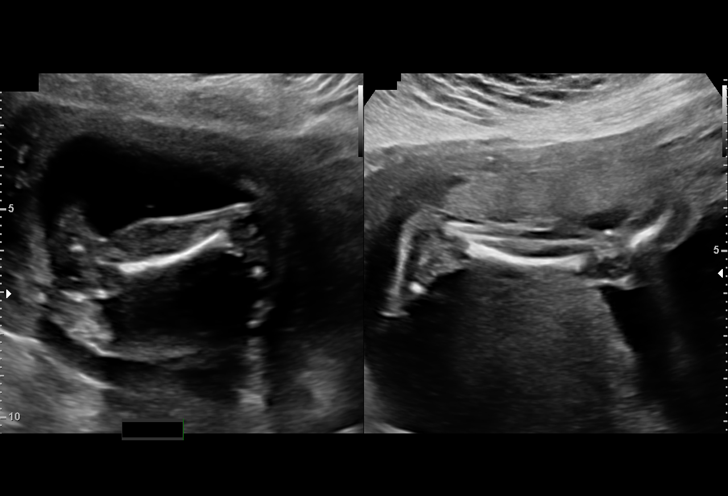
[im 49/70]
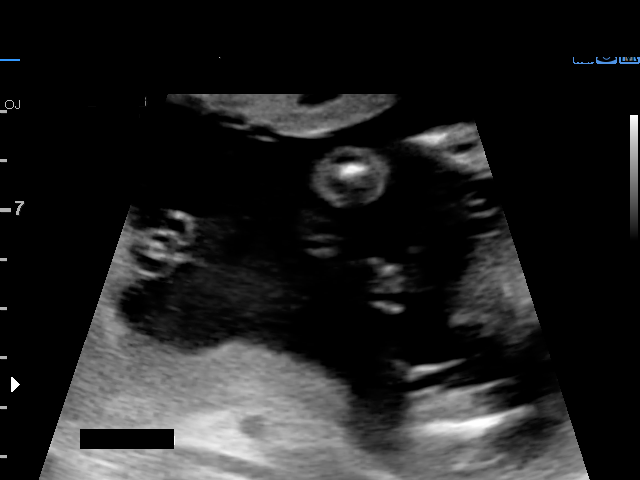
[im 54/70]
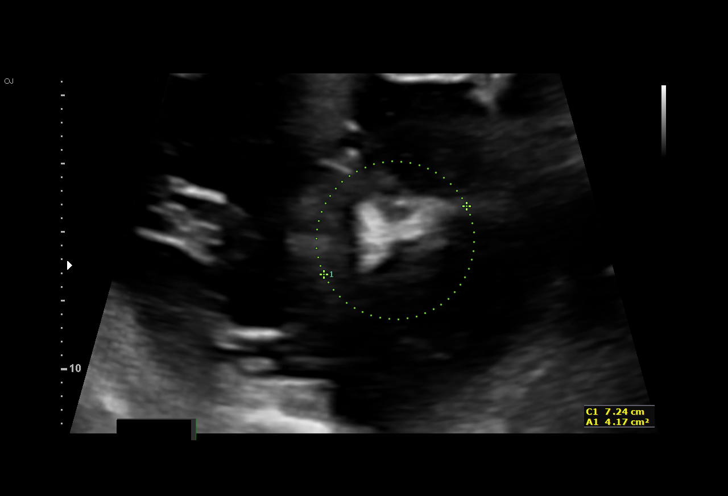
[im 59/70]
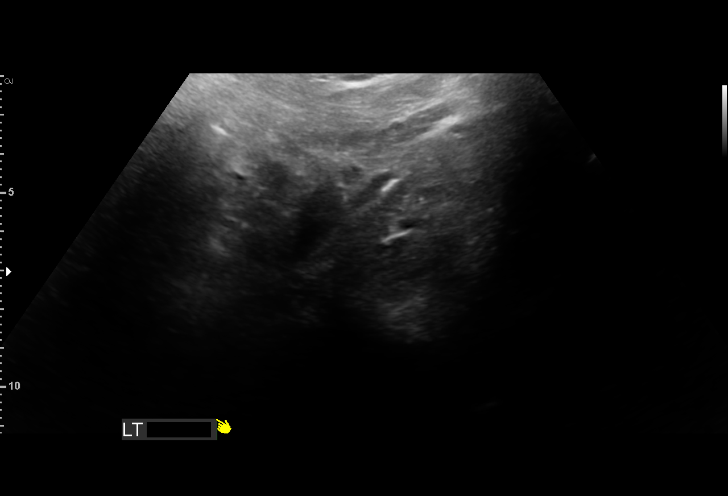
[im 64/70]
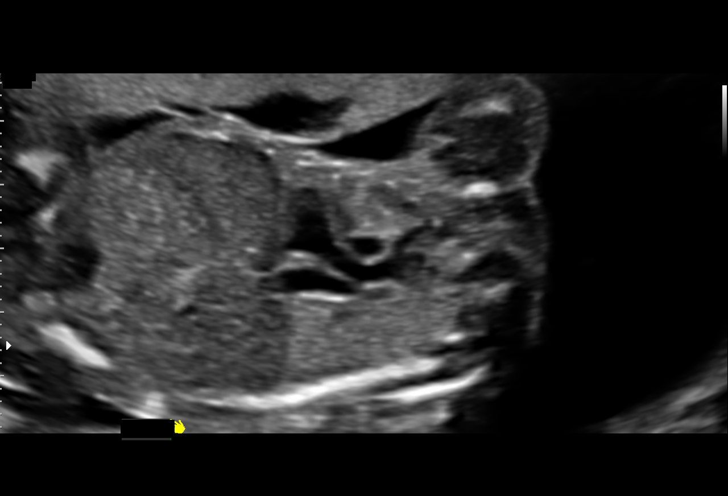
[im 70/70]
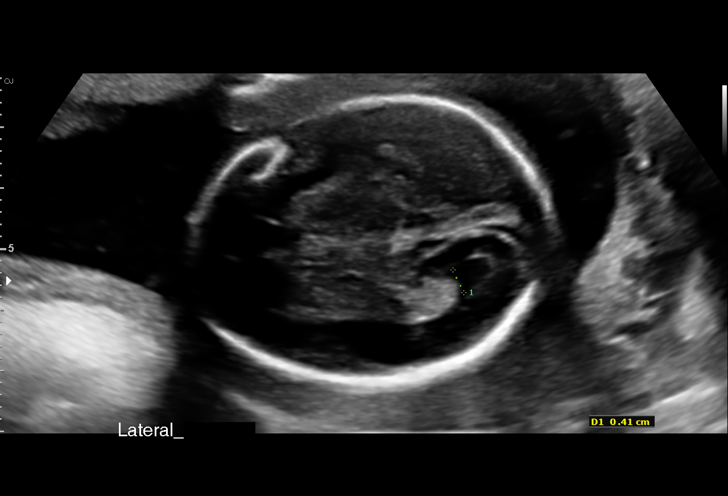

[14 of 28 positions shown; findings below may reference images not displayed]

1  YEFRRY TUNAROSA           [id number]      [id number]     [id number]
Indications

19 weeks gestation of pregnancy
Encounter for antenatal screening for
malformations
OB History

Blood Type:            Height:  5'4"   Weight (lb):  183       BMI:
Gravidity:    5         Term:   3        Prem:   0        SAB:   1
TOP:          0       Ectopic:  0        Living: 3
Fetal Evaluation

Num Of Fetuses:     1
Fetal Heart         161
Rate(bpm):
Cardiac Activity:   Observed
Presentation:       Cephalic
Placenta:           Anterior, above cervical os
P. Cord Insertion:  Visualized

Amniotic Fluid
AFI FV:      Subjectively within normal limits

Largest Pocket(cm)
5.03
Biometry

BPD:      45.4  mm     G. Age:  19w 5d         44  %    CI:        75.89   %    70 - 86
FL/HC:      19.2   %    16.8 -
HC:      165.2  mm     G. Age:  19w 2d         17  %    HC/AC:      1.19        1.09 -
AC:      139.4  mm     G. Age:  19w 2d         28  %    FL/BPD:     69.8   %
FL:       31.7  mm     G. Age:  19w 6d         43  %    FL/AC:      22.7   %    20 - 24
HUM:      31.7  mm     G. Age:  20w 4d         72  %
CER:      18.5  mm     G. Age:  18w 2d        < 5  %
NFT:       4.6  mm

CM:          5  mm

Est. FW:     299  gm    0 lb 11 oz      43  %
Gestational Age

U/S Today:     19w 4d                                        EDD:   07/05/17
Best:          19w 6d     Det. By:  Early Ultrasound         EDD:   07/03/17
(01/01/17)
Anatomy

Cranium:               Appears normal         Aortic Arch:            Appears normal
Cavum:                 Appears normal         Ductal Arch:            Appears normal
Ventricles:            Appears normal         Diaphragm:              Appears normal
Choroid Plexus:        Appears normal         Stomach:                Appears normal, left
sided
Cerebellum:            Appears normal         Abdomen:                Appears normal
Posterior Fossa:       Appears normal         Abdominal Wall:         Appears nml (cord
insert, abd wall)
Nuchal Fold:           Appears normal         Cord Vessels:           Appears normal (3
vessel cord)
Face:                  Appears normal         Kidneys:                Appear normal
(orbits and profile)
Lips:                  Appears normal         Bladder:                Appears normal
Thoracic:              Appears normal         Spine:                  Appears normal
Heart:                 Appears normal         Upper Extremities:      Appears normal
(4CH, axis, and situs
RVOT:                  Appears normal         Lower Extremities:      Appears normal
LVOT:                  Appears normal

Other:  Female gender. Heels visualized.
Cervix Uterus Adnexa

Cervix
Length:           3.56  cm.
Normal appearance by transabdominal scan.

Uterus
No abnormality visualized.

Left Ovary
Not visualized. No adnexal mass visualized.

Right Ovary
Size(cm)       3.2  x   1.5    x  2.2       Vol(ml):
Within normal limits.

Cul De Sac:   No free fluid seen.

Adnexa:       No abnormality visualized.
Impression

Singleton intrauterine pregnancy at 19+6 weeks, here for
anatomic survey
Review of the anatomy shows no sonographic markers for
aneuploidy or structural anomalies
Amniotic fluid volume is normal
Estimated fetal weight is 299g which is growth in the 43rd
percentile
Recommendations

Follow-up ultrasounds as clinically indicated.

## 2018-08-24 NOTE — Telephone Encounter (Signed)
Error
